# Patient Record
Sex: Female | Born: 1965 | Race: Black or African American | Hispanic: No | Marital: Single | Smoking: Never smoker
Health system: Southern US, Community
[De-identification: ages and names within clinical notes are randomized; demographics above are authoritative.]

## PROBLEM LIST (undated history)

## (undated) ENCOUNTER — Ambulatory Visit (HOSPITAL_COMMUNITY): Admission: EM | Payer: Medicaid Other | Source: Home / Self Care

## (undated) DIAGNOSIS — F419 Anxiety disorder, unspecified: Secondary | ICD-10-CM

## (undated) DIAGNOSIS — E119 Type 2 diabetes mellitus without complications: Secondary | ICD-10-CM

## (undated) DIAGNOSIS — K219 Gastro-esophageal reflux disease without esophagitis: Secondary | ICD-10-CM

## (undated) DIAGNOSIS — N289 Disorder of kidney and ureter, unspecified: Secondary | ICD-10-CM

## (undated) DIAGNOSIS — N200 Calculus of kidney: Secondary | ICD-10-CM

## (undated) HISTORY — DX: Calculus of kidney: N20.0

## (undated) HISTORY — DX: Disorder of kidney and ureter, unspecified: N28.9

---

## 1998-09-12 ENCOUNTER — Ambulatory Visit (HOSPITAL_COMMUNITY): Admission: RE | Admit: 1998-09-12 | Discharge: 1998-09-12 | Payer: Self-pay | Admitting: *Deleted

## 1999-01-03 ENCOUNTER — Inpatient Hospital Stay (HOSPITAL_COMMUNITY): Admission: AD | Admit: 1999-01-03 | Discharge: 1999-01-03 | Payer: Self-pay | Admitting: *Deleted

## 1999-01-14 ENCOUNTER — Inpatient Hospital Stay (HOSPITAL_COMMUNITY): Admission: AD | Admit: 1999-01-14 | Discharge: 1999-01-14 | Payer: Self-pay | Admitting: *Deleted

## 1999-01-15 ENCOUNTER — Ambulatory Visit (HOSPITAL_COMMUNITY): Admission: RE | Admit: 1999-01-15 | Discharge: 1999-01-15 | Payer: Self-pay | Admitting: Obstetrics

## 1999-01-21 ENCOUNTER — Inpatient Hospital Stay (HOSPITAL_COMMUNITY): Admission: AD | Admit: 1999-01-21 | Discharge: 1999-01-22 | Payer: Self-pay | Admitting: *Deleted

## 1999-01-22 ENCOUNTER — Inpatient Hospital Stay (HOSPITAL_COMMUNITY): Admission: AD | Admit: 1999-01-22 | Discharge: 1999-01-22 | Payer: Self-pay | Admitting: *Deleted

## 1999-01-24 ENCOUNTER — Inpatient Hospital Stay (HOSPITAL_COMMUNITY): Admission: AD | Admit: 1999-01-24 | Discharge: 1999-01-26 | Payer: Self-pay | Admitting: Obstetrics & Gynecology

## 1999-01-25 ENCOUNTER — Encounter: Payer: Self-pay | Admitting: Obstetrics & Gynecology

## 2000-02-01 ENCOUNTER — Emergency Department (HOSPITAL_COMMUNITY): Admission: EM | Admit: 2000-02-01 | Discharge: 2000-02-01 | Payer: Self-pay | Admitting: Emergency Medicine

## 2000-10-02 ENCOUNTER — Emergency Department (HOSPITAL_COMMUNITY): Admission: EM | Admit: 2000-10-02 | Discharge: 2000-10-02 | Payer: Self-pay | Admitting: Emergency Medicine

## 2001-02-09 ENCOUNTER — Inpatient Hospital Stay (HOSPITAL_COMMUNITY): Admission: AD | Admit: 2001-02-09 | Discharge: 2001-02-09 | Payer: Self-pay | Admitting: *Deleted

## 2003-09-16 ENCOUNTER — Emergency Department (HOSPITAL_COMMUNITY): Admission: EM | Admit: 2003-09-16 | Discharge: 2003-09-16 | Payer: Self-pay | Admitting: Emergency Medicine

## 2003-10-07 ENCOUNTER — Emergency Department (HOSPITAL_COMMUNITY): Admission: EM | Admit: 2003-10-07 | Discharge: 2003-10-07 | Payer: Self-pay | Admitting: Emergency Medicine

## 2003-10-08 ENCOUNTER — Emergency Department (HOSPITAL_COMMUNITY): Admission: EM | Admit: 2003-10-08 | Discharge: 2003-10-08 | Payer: Self-pay | Admitting: Emergency Medicine

## 2004-08-22 ENCOUNTER — Emergency Department (HOSPITAL_COMMUNITY): Admission: EM | Admit: 2004-08-22 | Discharge: 2004-08-22 | Payer: Self-pay | Admitting: Family Medicine

## 2004-10-09 ENCOUNTER — Emergency Department (HOSPITAL_COMMUNITY): Admission: EM | Admit: 2004-10-09 | Discharge: 2004-10-09 | Payer: Self-pay | Admitting: Family Medicine

## 2005-04-08 ENCOUNTER — Emergency Department (HOSPITAL_COMMUNITY): Admission: EM | Admit: 2005-04-08 | Discharge: 2005-04-08 | Payer: Self-pay | Admitting: Emergency Medicine

## 2005-11-10 ENCOUNTER — Emergency Department (HOSPITAL_COMMUNITY): Admission: EM | Admit: 2005-11-10 | Discharge: 2005-11-10 | Payer: Self-pay | Admitting: Family Medicine

## 2005-11-13 ENCOUNTER — Ambulatory Visit (HOSPITAL_COMMUNITY): Admission: RE | Admit: 2005-11-13 | Discharge: 2005-11-13 | Payer: Self-pay | Admitting: Pulmonary Disease

## 2006-05-25 ENCOUNTER — Encounter: Admission: RE | Admit: 2006-05-25 | Discharge: 2006-05-25 | Payer: Self-pay | Admitting: *Deleted

## 2006-07-26 ENCOUNTER — Emergency Department (HOSPITAL_COMMUNITY): Admission: EM | Admit: 2006-07-26 | Discharge: 2006-07-26 | Payer: Self-pay | Admitting: Emergency Medicine

## 2006-07-31 ENCOUNTER — Emergency Department (HOSPITAL_COMMUNITY): Admission: EM | Admit: 2006-07-31 | Discharge: 2006-07-31 | Payer: Self-pay | Admitting: *Deleted

## 2007-06-02 ENCOUNTER — Emergency Department (HOSPITAL_COMMUNITY): Admission: EM | Admit: 2007-06-02 | Discharge: 2007-06-02 | Payer: Self-pay | Admitting: Family Medicine

## 2007-12-17 ENCOUNTER — Emergency Department (HOSPITAL_COMMUNITY): Admission: EM | Admit: 2007-12-17 | Discharge: 2007-12-17 | Payer: Self-pay | Admitting: Family Medicine

## 2008-09-19 ENCOUNTER — Emergency Department (HOSPITAL_COMMUNITY): Admission: EM | Admit: 2008-09-19 | Discharge: 2008-09-19 | Payer: Self-pay | Admitting: Emergency Medicine

## 2009-11-04 ENCOUNTER — Inpatient Hospital Stay (HOSPITAL_COMMUNITY): Admission: AD | Admit: 2009-11-04 | Discharge: 2009-11-04 | Payer: Self-pay | Admitting: Obstetrics & Gynecology

## 2009-11-04 ENCOUNTER — Ambulatory Visit: Payer: Self-pay | Admitting: Physician Assistant

## 2009-12-06 ENCOUNTER — Ambulatory Visit: Payer: Self-pay | Admitting: Obstetrics and Gynecology

## 2010-02-25 ENCOUNTER — Emergency Department (HOSPITAL_COMMUNITY): Admission: EM | Admit: 2010-02-25 | Discharge: 2010-02-26 | Payer: Self-pay | Admitting: Emergency Medicine

## 2010-06-30 ENCOUNTER — Emergency Department (HOSPITAL_COMMUNITY): Admission: EM | Admit: 2010-06-30 | Discharge: 2010-07-01 | Payer: Self-pay | Admitting: Emergency Medicine

## 2010-07-13 ENCOUNTER — Emergency Department (HOSPITAL_COMMUNITY): Admission: EM | Admit: 2010-07-13 | Discharge: 2010-07-13 | Payer: Self-pay | Admitting: Emergency Medicine

## 2010-07-16 ENCOUNTER — Emergency Department (HOSPITAL_COMMUNITY): Admission: EM | Admit: 2010-07-16 | Discharge: 2010-07-17 | Payer: Self-pay | Admitting: Emergency Medicine

## 2010-09-11 ENCOUNTER — Inpatient Hospital Stay (HOSPITAL_COMMUNITY): Admission: AD | Admit: 2010-09-11 | Discharge: 2010-09-11 | Payer: Self-pay | Admitting: Obstetrics & Gynecology

## 2010-09-11 ENCOUNTER — Ambulatory Visit: Payer: Self-pay | Admitting: Obstetrics & Gynecology

## 2010-12-22 ENCOUNTER — Encounter: Payer: Self-pay | Admitting: Family Medicine

## 2011-02-12 ENCOUNTER — Inpatient Hospital Stay (HOSPITAL_COMMUNITY)
Admission: AD | Admit: 2011-02-12 | Discharge: 2011-02-12 | Disposition: A | Discharge status: Home / Self Care | Payer: Medicaid Other | Source: Ambulatory Visit | Attending: Obstetrics & Gynecology | Admitting: Obstetrics & Gynecology

## 2011-02-12 ENCOUNTER — Other Ambulatory Visit: Payer: Self-pay | Admitting: Obstetrics & Gynecology

## 2011-02-12 DIAGNOSIS — N938 Other specified abnormal uterine and vaginal bleeding: Secondary | ICD-10-CM | POA: Insufficient documentation

## 2011-02-12 DIAGNOSIS — N949 Unspecified condition associated with female genital organs and menstrual cycle: Secondary | ICD-10-CM | POA: Insufficient documentation

## 2011-02-12 DIAGNOSIS — N925 Other specified irregular menstruation: Secondary | ICD-10-CM

## 2011-02-12 DIAGNOSIS — D259 Leiomyoma of uterus, unspecified: Secondary | ICD-10-CM

## 2011-02-12 LAB — WET PREP, GENITAL
Trich, Wet Prep: NONE SEEN
Yeast Wet Prep HPF POC: NONE SEEN

## 2011-02-12 LAB — POCT PREGNANCY, URINE: Preg Test, Ur: NEGATIVE

## 2011-02-12 LAB — GC/CHLAMYDIA PROBE AMP, GENITAL
Chlamydia, DNA Probe: NEGATIVE
GC Probe Amp, Genital: NEGATIVE

## 2011-02-14 LAB — DIFFERENTIAL
Basophils Absolute: 0 10*3/uL (ref 0.0–0.1)
Basophils Absolute: 0 10*3/uL (ref 0.0–0.1)
Basophils Relative: 0 % (ref 0–1)
Basophils Relative: 0 % (ref 0–1)
Eosinophils Absolute: 0.1 10*3/uL (ref 0.0–0.7)
Eosinophils Absolute: 0.1 10*3/uL (ref 0.0–0.7)
Eosinophils Relative: 0 % (ref 0–5)
Eosinophils Relative: 1 % (ref 0–5)
Lymphocytes Relative: 14 % (ref 12–46)
Lymphocytes Relative: 5 % — ABNORMAL LOW (ref 12–46)
Lymphs Abs: 0.8 10*3/uL (ref 0.7–4.0)
Lymphs Abs: 1.9 10*3/uL (ref 0.7–4.0)
Monocytes Absolute: 0.8 10*3/uL (ref 0.1–1.0)
Monocytes Absolute: 0.9 10*3/uL (ref 0.1–1.0)
Monocytes Relative: 6 % (ref 3–12)
Monocytes Relative: 6 % (ref 3–12)
Neutro Abs: 10.9 10*3/uL — ABNORMAL HIGH (ref 1.7–7.7)
Neutro Abs: 12.4 10*3/uL — ABNORMAL HIGH (ref 1.7–7.7)
Neutrophils Relative %: 79 % — ABNORMAL HIGH (ref 43–77)
Neutrophils Relative %: 88 % — ABNORMAL HIGH (ref 43–77)

## 2011-02-14 LAB — BASIC METABOLIC PANEL
BUN: 12 mg/dL (ref 6–23)
CO2: 22 mEq/L (ref 19–32)
Calcium: 8.8 mg/dL (ref 8.4–10.5)
Chloride: 106 mEq/L (ref 96–112)
Creatinine, Ser: 0.76 mg/dL (ref 0.4–1.2)
GFR calc Af Amer: >60 mL/min (ref >60)
GFR calc non Af Amer: >60 mL/min (ref >60)
Glucose, Bld: 132 mg/dL — ABNORMAL HIGH (ref 70–99)
Potassium: 3.6 mEq/L (ref 3.5–5.1)
Sodium: 136 mEq/L (ref 135–145)

## 2011-02-14 LAB — CBC
HCT: 29.4 % — ABNORMAL LOW (ref 36.0–46.0)
HCT: 32.8 % — ABNORMAL LOW (ref 36.0–46.0)
Hemoglobin: 11.1 g/dL — ABNORMAL LOW (ref 12.0–15.0)
Hemoglobin: 9.9 g/dL — ABNORMAL LOW (ref 12.0–15.0)
MCH: 29.4 pg (ref 26.0–34.0)
MCH: 30 pg (ref 26.0–34.0)
MCHC: 33.6 g/dL (ref 30.0–36.0)
MCHC: 33.9 g/dL (ref 30.0–36.0)
MCV: 87.6 fL (ref 78.0–100.0)
MCV: 88.4 fL (ref 78.0–100.0)
Platelets: 240 10*3/uL (ref 150–400)
Platelets: 340 10*3/uL (ref 150–400)
RBC: 3.35 MIL/uL — ABNORMAL LOW (ref 3.87–5.11)
RBC: 3.71 MIL/uL — ABNORMAL LOW (ref 3.87–5.11)
RDW: 18.7 % — ABNORMAL HIGH (ref 11.5–15.5)
RDW: 18.9 % — ABNORMAL HIGH (ref 11.5–15.5)
WBC: 13.7 10*3/uL — ABNORMAL HIGH (ref 4.0–10.5)
WBC: 14.1 10*3/uL — ABNORMAL HIGH (ref 4.0–10.5)

## 2011-02-14 LAB — POCT I-STAT, CHEM 8
BUN: 8 mg/dL (ref 6–23)
Calcium, Ion: 1.04 mmol/L — ABNORMAL LOW (ref 1.12–1.32)
Chloride: 103 mEq/L (ref 96–112)
Creatinine, Ser: 0.9 mg/dL (ref 0.4–1.2)
Glucose, Bld: 110 mg/dL — ABNORMAL HIGH (ref 70–99)
HCT: 32 % — ABNORMAL LOW (ref 36.0–46.0)
Hemoglobin: 10.9 g/dL — ABNORMAL LOW (ref 12.0–15.0)
Potassium: 3.1 mEq/L — ABNORMAL LOW (ref 3.5–5.1)
Sodium: 137 mEq/L (ref 135–145)
TCO2: 24 mmol/L (ref 0–100)

## 2011-02-15 LAB — URINALYSIS, ROUTINE W REFLEX MICROSCOPIC
Glucose, UA: NEGATIVE mg/dL
Hgb urine dipstick: NEGATIVE
Ketones, ur: 40 mg/dL — AB
Leukocytes, UA: NEGATIVE
Nitrite: NEGATIVE
Protein, ur: 30 mg/dL — AB
Specific Gravity, Urine: 1.028 (ref 1.005–1.030)
Urobilinogen, UA: 1 mg/dL (ref 0.0–1.0)
pH: 7 (ref 5.0–8.0)

## 2011-02-15 LAB — POCT I-STAT, CHEM 8
BUN: 10 mg/dL (ref 6–23)
Calcium, Ion: 1.07 mmol/L — ABNORMAL LOW (ref 1.12–1.32)
Chloride: 108 mEq/L (ref 96–112)
Creatinine, Ser: 0.7 mg/dL (ref 0.4–1.2)
Glucose, Bld: 125 mg/dL — ABNORMAL HIGH (ref 70–99)
HCT: 41 % (ref 36.0–46.0)
Hemoglobin: 13.9 g/dL (ref 12.0–15.0)
Potassium: 3.5 mEq/L (ref 3.5–5.1)
Sodium: 143 mEq/L (ref 135–145)
TCO2: 25 mmol/L (ref 0–100)

## 2011-02-15 LAB — URINE MICROSCOPIC-ADD ON

## 2011-03-04 LAB — CBC
HCT: 31.6 % — ABNORMAL LOW (ref 36.0–46.0)
Hemoglobin: 10 g/dL — ABNORMAL LOW (ref 12.0–15.0)
MCHC: 31.7 g/dL (ref 30.0–36.0)
MCV: 82.7 fL (ref 78.0–100.0)
Platelets: 348 10*3/uL (ref 150–400)
RBC: 3.82 MIL/uL — ABNORMAL LOW (ref 3.87–5.11)
RDW: 17.4 % — ABNORMAL HIGH (ref 11.5–15.5)
WBC: 5.2 10*3/uL (ref 4.0–10.5)

## 2011-03-04 LAB — WET PREP, GENITAL
Trich, Wet Prep: NONE SEEN
WBC, Wet Prep HPF POC: NONE SEEN
Yeast Wet Prep HPF POC: NONE SEEN

## 2011-03-04 LAB — GC/CHLAMYDIA PROBE AMP, GENITAL
Chlamydia, DNA Probe: NEGATIVE
GC Probe Amp, Genital: POSITIVE — AB

## 2011-03-04 LAB — POCT PREGNANCY, URINE: Preg Test, Ur: NEGATIVE

## 2013-04-30 ENCOUNTER — Encounter (HOSPITAL_COMMUNITY): Payer: Self-pay | Admitting: *Deleted

## 2013-04-30 ENCOUNTER — Emergency Department (HOSPITAL_COMMUNITY)
Admission: EM | Admit: 2013-04-30 | Discharge: 2013-04-30 | Disposition: A | Discharge status: Home / Self Care | Payer: Medicaid Other | Attending: Emergency Medicine | Admitting: Emergency Medicine

## 2013-04-30 DIAGNOSIS — J069 Acute upper respiratory infection, unspecified: Secondary | ICD-10-CM | POA: Insufficient documentation

## 2013-04-30 MED ORDER — BENZONATATE 100 MG PO CAPS: 200.0000 mg | ORAL_CAPSULE | Freq: Two times a day (BID) | ORAL | Status: DC | PRN

## 2013-04-30 MED ORDER — OXYMETAZOLINE HCL 0.05 % NA SOLN: 2.0000 | Freq: Two times a day (BID) | NASAL | Status: DC

## 2013-04-30 NOTE — ED Notes (Signed)
Pt is here with ears ringing and congestion and reports throat drainage

## 2013-04-30 NOTE — ED Provider Notes (Signed)
History     CSN: [id number]  Arrival date & time 04/30/13  1114   First MD Initiated Contact with Patient 04/30/13 1138      Chief Complaint  Patient presents with  . Nasal Congestion    (Consider location/radiation/quality/duration/timing/severity/associated sxs/prior treatment) HPI Comments: 47 y.o. Female with no past medical history presents complaining about sinus congestion and dry cough for the last three days. Pt has been trying Musinex without relief.  Patient is a 47 y.o. female presenting with URI. The history is provided by the patient.  URI Presenting symptoms: cough   Presenting symptoms: no fever and no sore throat   Presenting symptoms comment:  Dry cough, congestion, stuffy ears, post nasal drip Severity:  Moderate Onset quality:  Gradual Duration: 4 days. Timing:  Constant Progression:  Unchanged Chronicity:  New Relieved by: pt has tried mucinex and OTC cough suppressant with mild relief. Worsened by:  Nothing tried Associated symptoms: no headaches, no myalgias, no neck pain, no sinus pain, no sneezing and no swollen glands   Risk factors: no recent travel and no sick contacts     History reviewed. No pertinent past medical history.  History reviewed. No pertinent past surgical history.  No family history on file.  History  Substance Use Topics  . Smoking status: Never Smoker   . Smokeless tobacco: Not on file  . Alcohol Use: No    OB History   Grav Para Term Preterm Abortions TAB SAB Ect Mult Living                  Review of Systems  Constitutional: Negative for fever and diaphoresis.  HENT: Positive for postnasal drip. Negative for sore throat, sneezing, trouble swallowing, neck pain, neck stiffness and sinus pressure.        "stuffy ears" bilaterally  Eyes: Negative for visual disturbance.  Respiratory: Positive for cough. Negative for apnea, chest tightness and shortness of breath.   Cardiovascular: Negative for chest pain and  palpitations.  Gastrointestinal: Negative for nausea, vomiting, abdominal pain, diarrhea and constipation.  Genitourinary: Negative for dysuria.  Musculoskeletal: Negative for myalgias and gait problem.  Skin: Negative for rash.  Neurological: Negative for dizziness, weakness, light-headedness, numbness and headaches.    Allergies  Review of patient's allergies indicates no known allergies.  Home Medications   Current Outpatient Rx  Name  Route  Sig  Dispense  Refill  . guaiFENesin (MUCINEX) 600 MG 12 hr tablet   Oral   Take 1,200 mg by mouth daily as needed for congestion.         . Pseudoeph-Doxylamine-DM-APAP 60-7.04-29-999 MG/30ML LIQD   Oral   Take 15 mLs by mouth once.           BP 124/71  Pulse 81  Temp(Src) 98.5 F (36.9 C) (Oral)  Resp 18  SpO2 98%  LMP 04/10/2013  Physical Exam  Nursing note and vitals reviewed. Constitutional: She is oriented to person, place, and time. She appears well-developed and well-nourished. No distress.  HENT:  Head: Normocephalic and atraumatic.  Right Ear: Tympanic membrane normal.  Left Ear: Tympanic membrane normal.  Nose: Mucosal edema present. No rhinorrhea or sinus tenderness. Right sinus exhibits no maxillary sinus tenderness and no frontal sinus tenderness. Left sinus exhibits no maxillary sinus tenderness and no frontal sinus tenderness.  Mouth/Throat: Posterior oropharyngeal erythema present. No oropharyngeal exudate or posterior oropharyngeal edema.  cobblestoning  Eyes: Conjunctivae and EOM are normal.  Neck: Normal range of motion. Neck supple.  No meningeal signs  Cardiovascular: Normal rate, regular rhythm and normal heart sounds.  Exam reveals no gallop and no friction rub.   No murmur heard. Pulmonary/Chest: Effort normal and breath sounds normal. No respiratory distress. She has no wheezes. She has no rales. She exhibits no tenderness.  Abdominal: Soft. Bowel sounds are normal. She exhibits no distension.  There is no tenderness. There is no rebound and no guarding.  Musculoskeletal: Normal range of motion. She exhibits no edema and no tenderness.  Neurological: She is alert and oriented to person, place, and time. No cranial nerve deficit.  Skin: Skin is warm and dry. She is not diaphoretic. No erythema.    ED Course  Procedures (including critical care time)  Labs Reviewed - No data to display No results found. Discharge Medication List as of 04/30/2013 12:45 PM    START taking these medications   Details  benzonatate (TESSALON) 100 MG capsule Take 2 capsules (200 mg total) by mouth 2 (two) times daily as needed for cough., Starting 04/30/2013, Until Discontinued, Print    oxymetazoline (AFRIN NASAL SPRAY) 0.05 % nasal spray Place 2 sprays into the nose 2 (two) times daily., Starting 04/30/2013, Until Discontinued, Print         1. Upper respiratory infection       MDM  Patients symptoms are consistent with URI, likely viral etiology. Discussed that antibiotics are not indicated for viral infections. Pt will be discharged with symptomatic treatment.  Verbalizes understanding and is agreeable with plan. Pt is hemodynamically stable & in NAD prior to dc.   Glade Nurse, PA-C 04/30/13 2123

## 2013-05-01 NOTE — ED Provider Notes (Signed)
Medical screening examination/treatment/procedure(s) were performed by non-physician practitioner and as supervising physician I was immediately available for consultation/collaboration.   Lisvet Rasheed, MD 05/01/13 0703 

## 2015-05-21 ENCOUNTER — Inpatient Hospital Stay (HOSPITAL_COMMUNITY): Payer: Medicaid Other

## 2015-05-21 ENCOUNTER — Encounter (HOSPITAL_COMMUNITY): Payer: Self-pay

## 2015-05-21 ENCOUNTER — Inpatient Hospital Stay (HOSPITAL_COMMUNITY)
Admission: AD | Admit: 2015-05-21 | Discharge: 2015-05-21 | Disposition: A | Discharge status: Home / Self Care | Payer: Medicaid Other | Source: Ambulatory Visit | Attending: Obstetrics and Gynecology | Admitting: Obstetrics and Gynecology

## 2015-05-21 DIAGNOSIS — R1030 Lower abdominal pain, unspecified: Secondary | ICD-10-CM | POA: Diagnosis not present

## 2015-05-21 DIAGNOSIS — N8320 Unspecified ovarian cysts: Secondary | ICD-10-CM | POA: Insufficient documentation

## 2015-05-21 DIAGNOSIS — D252 Subserosal leiomyoma of uterus: Secondary | ICD-10-CM

## 2015-05-21 DIAGNOSIS — D259 Leiomyoma of uterus, unspecified: Secondary | ICD-10-CM | POA: Diagnosis not present

## 2015-05-21 DIAGNOSIS — R109 Unspecified abdominal pain: Secondary | ICD-10-CM

## 2015-05-21 LAB — URINALYSIS, ROUTINE W REFLEX MICROSCOPIC
Bilirubin Urine: NEGATIVE
Glucose, UA: NEGATIVE mg/dL
Hgb urine dipstick: NEGATIVE
Ketones, ur: NEGATIVE mg/dL
Leukocytes, UA: NEGATIVE
Nitrite: NEGATIVE
Protein, ur: NEGATIVE mg/dL
Specific Gravity, Urine: >1.03 — ABNORMAL HIGH (ref 1.005–1.030)
Urobilinogen, UA: 1 mg/dL (ref 0.0–1.0)
pH: 5.5 (ref 5.0–8.0)

## 2015-05-21 LAB — CBC
HCT: 37.5 % (ref 36.0–46.0)
Hemoglobin: 12.7 g/dL (ref 12.0–15.0)
MCH: 31.8 pg (ref 26.0–34.0)
MCHC: 33.9 g/dL (ref 30.0–36.0)
MCV: 93.8 fL (ref 78.0–100.0)
Platelets: 218 10*3/uL (ref 150–400)
RBC: 4 MIL/uL (ref 3.87–5.11)
RDW: 13.7 % (ref 11.5–15.5)
WBC: 4.5 10*3/uL (ref 4.0–10.5)

## 2015-05-21 LAB — WET PREP, GENITAL
Trich, Wet Prep: NONE SEEN
Yeast Wet Prep HPF POC: NONE SEEN

## 2015-05-21 LAB — POCT PREGNANCY, URINE: Preg Test, Ur: NEGATIVE

## 2015-05-21 MED ORDER — IBUPROFEN 800 MG PO TABS: 800.0000 mg | ORAL_TABLET | Freq: Three times a day (TID) | ORAL | Status: DC

## 2015-05-21 MED ORDER — KETOROLAC TROMETHAMINE 60 MG/2ML IM SOLN
60.0000 mg | Freq: Once | INTRAMUSCULAR | Status: AC
  Administered 2015-05-21: 60 mg via INTRAMUSCULAR
  Filled 2015-05-21: qty 2

## 2015-05-21 NOTE — MAU Note (Signed)
Pt presents complaining of abdominal pain that she's had for 2 weeks but got worse the last couple of days. Denies constipation or diarrhea. Reports a yellow vaginal discharge and pain after urinating.

## 2015-05-21 NOTE — MAU Provider Note (Signed)
History     CSN: [id number]  Arrival date and time: 05/21/15 1143   None     Chief Complaint  Patient presents with  . Abdominal Pain   HPI Pt is not pregnant - pt's LMP 2 mos ago.  Pt c/o of yellow vaginal discharge for about 1 week.  Had IC 1 week ago and had to stop b/c lower abdominal pain.  Pt also c/o pain/pressure with  urinating - she has had frequency of urination with incomplete emptying  Pt denies N/V/D or back pain. Pt has hx of fibroids  RN note:  Registered Nurse Signed  MAU Note 05/21/2015 11:58 AM    Expand All Collapse All   Pt presents complaining of abdominal pain that she's had for 2 weeks but got worse the last couple of days. Denies constipation or diarrhea. Reports a yellow vaginal discharge and pain after urinating.        History reviewed. No pertinent past medical history.  History reviewed. No pertinent past surgical history.  History reviewed. No pertinent family history.  History  Substance Use Topics  . Smoking status: Never Smoker   . Smokeless tobacco: Not on file  . Alcohol Use: No    Allergies: No Known Allergies  Prescriptions prior to admission  Medication Sig Dispense Refill Last Dose  . benzonatate (TESSALON) 100 MG capsule Take 2 capsules (200 mg total) by mouth 2 (two) times daily as needed for cough. 20 capsule 0   . guaiFENesin (MUCINEX) 600 MG 12 hr tablet Take 1,200 mg by mouth daily as needed for congestion.   04/29/2013 at Unknown  . oxymetazoline (AFRIN NASAL SPRAY) 0.05 % nasal spray Place 2 sprays into the nose 2 (two) times daily. 30 mL 0   . Pseudoeph-Doxylamine-DM-APAP 60-7.04-29-999 MG/30ML LIQD Take 15 mLs by mouth once.   04/29/2013 at Unknown    Review of Systems  Constitutional: Negative for fever and chills.  Gastrointestinal: Positive for abdominal pain. Negative for nausea, vomiting and diarrhea.  Genitourinary: Positive for dysuria, urgency and frequency.  Musculoskeletal: Negative for back pain.    Physical Exam   Blood pressure 123/84, pulse 114, temperature 98 F (36.7 C), temperature source Oral, resp. rate 18.  Physical Exam  Nursing note and vitals reviewed. Constitutional: She is oriented to person, place, and time. She appears well-developed and well-nourished. No distress.  HENT:  Head: Normocephalic.  Eyes: Pupils are equal, round, and reactive to light.  Neck: Normal range of motion. Neck supple.  Cardiovascular: Normal rate.   Respiratory: Effort normal.  GI: Soft. She exhibits no distension. There is tenderness. There is guarding. There is no rebound.  Genitourinary:  Vaginal mucosa pale pink, moist; small amount of white discharge in vault; cervix not visualized due to habitus- but nontender with palpation; bilateral adnexa extremely tender with palpation- no rebound; uterus difficult to palpate - suprapubic tenderness- no rebound  Musculoskeletal: Normal range of motion.  Neurological: She is alert and oriented to person, place, and time.  Skin: Skin is warm and dry.  Psychiatric: She has a normal mood and affect.    MAU Course  Procedures Results for orders placed or performed during the hospital encounter of 05/21/15 (from the past 24 hour(s))  Urinalysis, Routine w reflex microscopic (not at [facility])     Status: Abnormal   Collection Time: 05/21/15 11:47 AM  Result Value Ref Range   Color, Urine YELLOW YELLOW   APPearance CLEAR CLEAR   Specific Gravity, Urine >1.030 (H)  1.005 - 1.030   pH 5.5 5.0 - 8.0   Glucose, UA NEGATIVE NEGATIVE mg/dL   Hgb urine dipstick NEGATIVE NEGATIVE   Bilirubin Urine NEGATIVE NEGATIVE   Ketones, ur NEGATIVE NEGATIVE mg/dL   Protein, ur NEGATIVE NEGATIVE mg/dL   Urobilinogen, UA 1.0 0.0 - 1.0 mg/dL   Nitrite NEGATIVE NEGATIVE   Leukocytes, UA NEGATIVE NEGATIVE  Pregnancy, urine POC     Status: None   Collection Time: 05/21/15 12:06 PM  Result Value Ref Range   Preg Test, Ur NEGATIVE NEGATIVE  CBC     Status: None    Collection Time: 05/21/15 12:13 PM  Result Value Ref Range   WBC 4.5 4.0 - 10.5 K/uL   RBC 4.00 3.87 - 5.11 MIL/uL   Hemoglobin 12.7 12.0 - 15.0 g/dL   HCT 37.5 36.0 - 46.0 %   MCV 93.8 78.0 - 100.0 fL   MCH 31.8 26.0 - 34.0 pg   MCHC 33.9 30.0 - 36.0 g/dL   RDW 13.7 11.5 - 15.5 %   Platelets 218 150 - 400 K/uL  Wet prep, genital     Status: Abnormal   Collection Time: 05/21/15 12:15 PM  Result Value Ref Range   Yeast Wet Prep HPF POC NONE SEEN NONE SEEN   Trich, Wet Prep NONE SEEN NONE SEEN   Clue Cells Wet Prep HPF POC FEW (A) NONE SEEN   WBC, Wet Prep HPF POC FEW (A) NONE SEEN  US Transvaginal Non-ob  05/21/2015   CLINICAL DATA:  Increasing abdominal pain.  Symptoms for 2 weeks.  EXAM: TRANSABDOMINAL AND TRANSVAGINAL ULTRASOUND OF PELVIS  TECHNIQUE: Both transabdominal and transvaginal ultrasound examinations of the pelvis were performed. Transabdominal technique was performed for global imaging of the pelvis including uterus, ovaries, adnexal regions, and pelvic cul-de-sac. It was necessary to proceed with endovaginal exam following the transabdominal exam to visualize the uterus and endometrium.  COMPARISON:  11/04/2009  FINDINGS: Uterus  Measurements: 10.8 x 7.1 x 8.7 cm. There are nabothian cysts. Normal appearance of the cervix. No significant fluid in the cervix. Normal anteverted position of the uterus. There is a round hypoechoic structure along the left side of the fundus measuring 2.8 x 2.4 x 2.7 cm. There is a central heterogeneous lesion in the uterus measuring 3.2 x 3.4 x 3.4 cm, suggestive for submucosal fibroid. Small lesion in the mid fundus region measures up to 1.6 cm. Heterogeneous structure along the anterior uterus measures 2.6 x 2.4 x 2.6 cm. Findings are compatible with multiple fibroids.  Endometrium  Thickness: 0.7 cm. Moderate amount of fluid within the endometrial cavity.  Right ovary  Measurements: 5.2 x 4.3 x 3.4 cm. There is an anechoic structure associated with  right ovary with internal echoes. Findings are most compatible with a complex cyst. This complex cyst measures 3.3 x 2.7 x 3.3 cm.  Left ovary  Measurements: 2.9 x 2.0 x 2.9 cm. Normal appearance/no adnexal mass.  Other findings  Trace free fluid.  IMPRESSION: Uterine fibroids.  There is a dominant submucosal fibroid.  Moderate amount of fluid within the endometrial cavity. Etiology for the endometrial fluid is unknown but could be related to the dominant submucosal fibroid.  Mildly complex right ovarian cyst.   Electronically Signed   By: Markus Daft M.D.   On: 05/21/2015 15:29   US Pelvis Complete  05/21/2015   CLINICAL DATA:  Increasing abdominal pain.  Symptoms for 2 weeks.  EXAM: TRANSABDOMINAL AND TRANSVAGINAL ULTRASOUND OF PELVIS  TECHNIQUE:  Both transabdominal and transvaginal ultrasound examinations of the pelvis were performed. Transabdominal technique was performed for global imaging of the pelvis including uterus, ovaries, adnexal regions, and pelvic cul-de-sac. It was necessary to proceed with endovaginal exam following the transabdominal exam to visualize the uterus and endometrium.  COMPARISON:  11/04/2009  FINDINGS: Uterus  Measurements: 10.8 x 7.1 x 8.7 cm. There are nabothian cysts. Normal appearance of the cervix. No significant fluid in the cervix. Normal anteverted position of the uterus. There is a round hypoechoic structure along the left side of the fundus measuring 2.8 x 2.4 x 2.7 cm. There is a central heterogeneous lesion in the uterus measuring 3.2 x 3.4 x 3.4 cm, suggestive for submucosal fibroid. Small lesion in the mid fundus region measures up to 1.6 cm. Heterogeneous structure along the anterior uterus measures 2.6 x 2.4 x 2.6 cm. Findings are compatible with multiple fibroids.  Endometrium  Thickness: 0.7 cm. Moderate amount of fluid within the endometrial cavity.  Right ovary  Measurements: 5.2 x 4.3 x 3.4 cm. There is an anechoic structure associated with right ovary with  internal echoes. Findings are most compatible with a complex cyst. This complex cyst measures 3.3 x 2.7 x 3.3 cm.  Left ovary  Measurements: 2.9 x 2.0 x 2.9 cm. Normal appearance/no adnexal mass.  Other findings  Trace free fluid.  IMPRESSION: Uterine fibroids.  There is a dominant submucosal fibroid.  Moderate amount of fluid within the endometrial cavity. Etiology for the endometrial fluid is unknown but could be related to the dominant submucosal fibroid.  Mildly complex right ovarian cyst.   Electronically Signed   By: Markus Daft M.D.   On: 05/21/2015 15:29  urine culture pending GC/chlamydia pending Assessment and Plan  abd pain Multiple fibroids Right ovarian complex cyst F/u with GYN clinic  [facility] 05/21/2015, 12:06 PM

## 2015-05-22 LAB — HIV ANTIBODY (ROUTINE TESTING W REFLEX): HIV Screen 4th Generation wRfx: NONREACTIVE

## 2015-05-22 LAB — GC/CHLAMYDIA PROBE AMP (~~LOC~~) NOT AT ARMC
Chlamydia: NEGATIVE
Neisseria Gonorrhea: NEGATIVE

## 2015-05-22 LAB — RPR: RPR Ser Ql: NONREACTIVE

## 2015-05-23 LAB — CULTURE, OB URINE

## 2015-06-22 ENCOUNTER — Emergency Department (HOSPITAL_COMMUNITY)
Admission: EM | Admit: 2015-06-22 | Discharge: 2015-06-22 | Disposition: A | Discharge status: Home / Self Care | Payer: Medicaid Other | Attending: Emergency Medicine | Admitting: Emergency Medicine

## 2015-06-22 DIAGNOSIS — R0981 Nasal congestion: Secondary | ICD-10-CM | POA: Diagnosis not present

## 2015-06-22 DIAGNOSIS — J029 Acute pharyngitis, unspecified: Secondary | ICD-10-CM | POA: Insufficient documentation

## 2015-06-22 DIAGNOSIS — Z791 Long term (current) use of non-steroidal anti-inflammatories (NSAID): Secondary | ICD-10-CM | POA: Insufficient documentation

## 2015-06-22 DIAGNOSIS — R05 Cough: Secondary | ICD-10-CM | POA: Diagnosis present

## 2015-06-22 DIAGNOSIS — R059 Cough, unspecified: Secondary | ICD-10-CM

## 2015-06-22 MED ORDER — BENZONATATE 100 MG PO CAPS: 100.0000 mg | ORAL_CAPSULE | Freq: Three times a day (TID) | ORAL | Status: DC

## 2015-06-22 NOTE — ED Notes (Signed)
Declined W/C at D/C and was escorted to lobby by RN. 

## 2015-06-22 NOTE — ED Notes (Signed)
Pt. Stated, I have a real bad cough that started yesterday.

## 2015-06-22 NOTE — ED Provider Notes (Signed)
CSN: [id number]     Arrival date & time 06/22/15  1051 History  This chart was scribed for non-physician practitioner, Larene Pickett, PA-C working with Orlie Dakin, MD by Rayna Sexton, ED scribe. This patient was seen in room TR08C/TR08C and the patient's care was started at 11:56 AM.   Chief Complaint  Patient presents with  . Cough  . Nasal Congestion   The history is provided by the patient. No language interpreter was used.    HPI Comments: Jasmin Collins is a 49 y.o. female who presents to the Emergency Department complaining of a worsening, moderate, productive cough with onset 1 day ago. She notes associated sputum when coughing as well as a mild, constant, sore throat. She notes taking cough syrup with no relief of her symptoms. Pt denies a hx of smoking. She denies fever or chills.   No past medical history on file. No past surgical history on file. No family history on file. History  Substance Use Topics  . Smoking status: Never Smoker   . Smokeless tobacco: Not on file  . Alcohol Use: No   OB History    No data available     Review of Systems  Constitutional: Negative for fever and chills.  HENT: Positive for sore throat.   Respiratory: Positive for cough.   All other systems reviewed and are negative.  Allergies  Review of patient's allergies indicates no known allergies.  Home Medications   Prior to Admission medications   Medication Sig Start Date End Date Taking? Authorizing Provider  benzonatate (TESSALON) 100 MG capsule Take 2 capsules (200 mg total) by mouth 2 (two) times daily as needed for cough. Patient not taking: Reported on 05/21/2015 04/30/13   Marny Lowenstein, PA-C  ibuprofen (ADVIL,MOTRIN) 200 MG tablet Take 400 mg by mouth every 6 (six) hours as needed for moderate pain.    Historical Provider, MD  ibuprofen (ADVIL,MOTRIN) 800 MG tablet Take 1 tablet (800 mg total) by mouth 3 (three) times daily. 05/21/15   West Pugh, NP   BP 127/84 mmHg   Pulse 100  Temp(Src) 98.6 F (37 C) (Oral)  Resp 17  Ht 5\' 3"  (1.6 m)  Wt 221 lb 1 oz (100.273 kg)  BMI 39.17 kg/m2   Physical Exam  Constitutional: She is oriented to person, place, and time. She appears well-developed and well-nourished. No distress.  HENT:  Head: Normocephalic and atraumatic.  Right Ear: Tympanic membrane and ear canal normal.  Left Ear: Tympanic membrane and ear canal normal.  Nose: Nose normal.  Mouth/Throat: Uvula is midline, oropharynx is clear and moist and mucous membranes are normal. No oropharyngeal exudate, posterior oropharyngeal edema, posterior oropharyngeal erythema or tonsillar abscesses.  Tonsils normal in appearance bilaterally without exudate; uvula midline without peritonsillar abscess; handling secretions appropriately; no difficulty swallowing or speaking  Eyes: Conjunctivae and EOM are normal. Pupils are equal, round, and reactive to light.  Neck: Normal range of motion. Neck supple.  Cardiovascular: Normal rate, regular rhythm and normal heart sounds.   Pulmonary/Chest: Effort normal and breath sounds normal. No respiratory distress. She has no wheezes. She has no rhonchi. She has no rales.  No distress, lungs clear bilaterally, dry cough noted  Abdominal: Soft. Bowel sounds are normal. There is no tenderness. There is no guarding.  Musculoskeletal: Normal range of motion. She exhibits no edema.  Neurological: She is alert and oriented to person, place, and time.  Skin: Skin is warm and dry. She is  not diaphoretic.  Psychiatric: She has a normal mood and affect.  Nursing note and vitals reviewed.   ED Course  Procedures  COORDINATION OF CARE: 11:59 AM Discussed treatment plan with pt at bedside and pt agreed to plan.  Labs Review Labs Reviewed - No data to display  Imaging Review No results found.   EKG Interpretation None      MDM   Final diagnoses:  Cough   49 year old female here with cough for 1 day. Patient is  afebrile, nontoxic. Her lungs are clear bilaterally without wheezes or rhonchi to suggest pneumonia. She does have a dry cough during exam. Remainder of her exam is benign. Suspect viral process.  Will discharge home with supportive care and cough medication. Patient is to follow-up with her PCP.  Discussed plan with patient, he/she acknowledged understanding and agreed with plan of care.  Return precautions given for new or worsening symptoms.  I personally performed the services described in this documentation, which was scribed in my presence. The recorded information has been reviewed and is accurate.  Larene Pickett, PA-C 06/22/15 1336  Orlie Dakin, MD 06/22/15 1537

## 2015-06-22 NOTE — Discharge Instructions (Signed)
Take the prescribed medication as directed for cough. Return to the ED for new or worsening symptoms.

## 2015-07-15 ENCOUNTER — Emergency Department (INDEPENDENT_AMBULATORY_CARE_PROVIDER_SITE_OTHER): Payer: Medicaid Other

## 2015-07-15 ENCOUNTER — Encounter (HOSPITAL_COMMUNITY): Payer: Self-pay | Admitting: Emergency Medicine

## 2015-07-15 ENCOUNTER — Emergency Department (INDEPENDENT_AMBULATORY_CARE_PROVIDER_SITE_OTHER)
Admission: EM | Admit: 2015-07-15 | Discharge: 2015-07-15 | Disposition: A | Discharge status: Home / Self Care | Payer: Medicaid Other | Source: Home / Self Care | Attending: Family Medicine | Admitting: Family Medicine

## 2015-07-15 DIAGNOSIS — S92912A Unspecified fracture of left toe(s), initial encounter for closed fracture: Secondary | ICD-10-CM

## 2015-07-15 NOTE — ED Notes (Signed)
Left foot pain.  Patient tripped over a toy and stumped left little toe into base board of wall.  Patient reports straightening toe.  Injury occurred last.

## 2015-07-15 NOTE — Discharge Instructions (Signed)
Ice, shoe and tylenol as needed, activity as tolerated.

## 2015-07-15 NOTE — ED Provider Notes (Signed)
CSN: [id number]     Arrival date & time 07/15/15  1702 History   First MD Initiated Contact with Patient 07/15/15 1715     Chief Complaint  Patient presents with  . Foot Pain   (Consider location/radiation/quality/duration/timing/severity/associated sxs/prior Treatment) Patient is a 49 y.o. female presenting with foot injury. The history is provided by the patient.  Foot Injury Location:  Toe (hit side of wall when tripped over child toy and had lat deformity which she straightened.) Time since incident:  1 day Toe location:  L little toe Pain details:    Quality:  Sharp   Severity:  Mild   Progression:  Partially resolved Chronicity:  New Dislocation: no   Foreign body present:  No foreign bodies Worsened by:  Bearing weight Associated symptoms: decreased ROM     History reviewed. No pertinent past medical history. History reviewed. No pertinent past surgical history. No family history on file. Social History  Substance Use Topics  . Smoking status: Never Smoker   . Smokeless tobacco: None  . Alcohol Use: No   OB History    No data available     Review of Systems  Constitutional: Negative.   Musculoskeletal: Positive for joint swelling and gait problem.  Skin: Negative.     Allergies  Review of patient's allergies indicates no known allergies.  Home Medications   Prior to Admission medications   Medication Sig Start Date End Date Taking? Authorizing Provider  benzonatate (TESSALON) 100 MG capsule Take 1 capsule (100 mg total) by mouth every 8 (eight) hours. 06/22/15   Larene Pickett, PA-C  ibuprofen (ADVIL,MOTRIN) 200 MG tablet Take 400 mg by mouth every 6 (six) hours as needed for moderate pain.    Historical Provider, MD  ibuprofen (ADVIL,MOTRIN) 800 MG tablet Take 1 tablet (800 mg total) by mouth 3 (three) times daily. 05/21/15   West Pugh, NP   BP 130/77 mmHg  Pulse 74  Temp(Src) 98.4 F (36.9 C) (Oral)  Resp 20  SpO2 97% Physical Exam    Constitutional: She is oriented to person, place, and time. She appears well-developed and well-nourished. She appears distressed.  Musculoskeletal: She exhibits tenderness.       Feet:  Neurological: She is alert and oriented to person, place, and time.  Skin: Skin is warm and dry.  Nursing note and vitals reviewed.   ED Course  Procedures (including critical care time) Labs Review Labs Reviewed - No data to display  Imaging Review Dg Toe 5th Left  07/15/2015   CLINICAL DATA:  Injury  EXAM: DG TOE 5TH LEFT  COMPARISON:  None.  FINDINGS: Three views of the left fifth toe submitted. There is nondisplaced fracture of distal phalanx.  IMPRESSION: Nondisplaced fracture of distal phalanx best seen on lateral view.   Electronically Signed   By: Lahoma Crocker M.D.   On: 07/15/2015 17:38     MDM   1. Toe fracture, left, closed, initial encounter        Billy Fischer, MD 07/15/15 1816

## 2015-09-26 ENCOUNTER — Emergency Department (HOSPITAL_COMMUNITY): Payer: Medicaid Other

## 2015-09-26 ENCOUNTER — Encounter (HOSPITAL_COMMUNITY): Payer: Self-pay | Admitting: *Deleted

## 2015-09-26 ENCOUNTER — Emergency Department (HOSPITAL_COMMUNITY)
Admission: EM | Admit: 2015-09-26 | Discharge: 2015-09-26 | Disposition: A | Discharge status: Home / Self Care | Payer: Medicaid Other | Attending: Emergency Medicine | Admitting: Emergency Medicine

## 2015-09-26 DIAGNOSIS — R05 Cough: Secondary | ICD-10-CM | POA: Insufficient documentation

## 2015-09-26 DIAGNOSIS — R059 Cough, unspecified: Secondary | ICD-10-CM

## 2015-09-26 MED ORDER — HYDROCOD POLST-CPM POLST ER 10-8 MG/5ML PO SUER
5.0000 mL | Freq: Once | ORAL | Status: AC
  Administered 2015-09-26: 5 mL via ORAL
  Filled 2015-09-26: qty 5

## 2015-09-26 MED ORDER — HYDROCOD POLST-CPM POLST ER 10-8 MG/5ML PO SUER: 5.0000 mL | Freq: Every evening | ORAL | Status: DC | PRN

## 2015-09-26 NOTE — Discharge Instructions (Signed)

## 2015-09-26 NOTE — ED Provider Notes (Signed)
CSN: [id number]     Arrival date & time 09/26/15  1007 History  By signing my name below, I, Erling Conte, attest that this documentation has been prepared under the direction and in the presence of Gloriann Loan, PA-C Electronically Signed: Erling Conte, ED Scribe. 09/26/2015. 12:02 PM.    Chief Complaint  Patient presents with  . Cough   The history is provided by the patient. No language interpreter was used.    HPI Comments: [name] is a 49 y.o. female with no PMHx who presents to the Emergency Department complaining of intermittent, gradually worsening, moderate, dry cough onset 2 days. Pt endorses that the cough was initially productive but now it has just become dry. She states she has mild associated rhinorrhea. She reports she had some leftover Tessalon pearls from a previous visit and she took them last night with no significant relief along with OTC cough meds. She states the cough has been keeping her from sleep. Pt is a non smoker and denies any personal history of asthma. She denies any known sick contacts. She denies any fever, chills, chest pain, SOB, wheezing, neck pain or stiffness, otalgia, nausea, vomiting, abdominal pain, or generalized myalgias.   History reviewed. No pertinent past medical history. History reviewed. No pertinent past surgical history. History reviewed. No pertinent family history. Social History  Substance Use Topics  . Smoking status: Never Smoker   . Smokeless tobacco: None  . Alcohol Use: No   OB History    No data available     Review of Systems 10 Systems reviewed and all are negative for acute change except as noted in the HPI.     Allergies  Review of patient's allergies indicates no known allergies.  Home Medications   Prior to Admission medications   Medication Sig Start Date End Date Taking? Authorizing Provider  benzonatate (TESSALON) 100 MG capsule Take 1 capsule (100 mg total) by mouth every 8 (eight) hours. 06/22/15    Larene Pickett, PA-C  chlorpheniramine-HYDROcodone (TUSSIONEX PENNKINETIC ER) 10-8 MG/5ML SUER Take 5 mLs by mouth at bedtime as needed for cough. 09/26/15   Gloriann Loan, PA-C  ibuprofen (ADVIL,MOTRIN) 200 MG tablet Take 400 mg by mouth every 6 (six) hours as needed for moderate pain.    Historical Provider, MD  ibuprofen (ADVIL,MOTRIN) 800 MG tablet Take 1 tablet (800 mg total) by mouth 3 (three) times daily. 05/21/15   West Pugh, NP   Triage Vitals: BP 124/78 mmHg  Pulse 86  Temp(Src) 98.6 F (37 C) (Oral)  Resp 16  SpO2 100%  Physical Exam  Constitutional: She is oriented to person, place, and time. She appears well-developed and well-nourished. No distress.  HENT:  Head: Normocephalic and atraumatic.  Nose: Nose normal.  Mouth/Throat: Oropharynx is clear and moist. No oropharyngeal exudate.  Eyes: Conjunctivae and EOM are normal.  Neck: Normal range of motion. Neck supple. No tracheal deviation present.  Cardiovascular: Normal rate, regular rhythm and normal heart sounds.   Pulmonary/Chest: Effort normal and breath sounds normal. No respiratory distress.  Abdominal: Soft. There is no tenderness.  Musculoskeletal: Normal range of motion.  Lymphadenopathy:    She has no cervical adenopathy.  Neurological: She is alert and oriented to person, place, and time.  Skin: Skin is warm and dry.  Psychiatric: She has a normal mood and affect. Her behavior is normal.  Nursing note and vitals reviewed.   ED Course  Procedures (including critical care time)  DIAGNOSTIC STUDIES:  Oxygen Saturation is 100% on RA, normal by my interpretation.    COORDINATION OF CARE:  10:28 AM- Will order CXR.  Pt advised of plan for treatment and pt agrees.  Labs Review Labs Reviewed - No data to display  Imaging Review Dg Chest 2 View  09/26/2015  CLINICAL DATA:  49 year old female with cough and congestion for 2 days. Initial encounter. EXAM: CHEST  2 VIEW COMPARISON:  02/25/2010.  FINDINGS: Lung volumes remain normal. Normal cardiac size and mediastinal contours. Visualized tracheal air column is within normal limits. The lungs are clear. No pneumothorax or pleural effusion. No osseous abnormality identified. IMPRESSION: Negative, no acute cardiopulmonary abnormality. Electronically Signed   By: Genevie Ann M.D.   On: 09/26/2015 11:00   I have personally reviewed and evaluated these images and lab results as part of my medical decision-making.   EKG Interpretation None      MDM   Final diagnoses:  Cough    Patient presents with dry cough.  No fevers, myalgias, shortness of breath.  VSS, will give tussionex and obtain CXR.  CXR shows no acute process.  Patient stable for discharge.  Discussed return precautions.  Patient agrees and acknowledges the above plan for discharge.   I personally performed the services described in this documentation, which was scribed in my presence. The recorded information has been reviewed and is accurate.    Gloriann Loan, PA-C 09/26/15 [place], MD 09/27/15 [phone]

## 2015-09-26 NOTE — ED Notes (Signed)
Pt states she has had a "real bad cough" since yesterday. Pt states she had some tessalon pearls left from a previous visit that didn't help alleviate sx.

## 2015-09-26 NOTE — ED Notes (Signed)
Pt is in stable condition upon d/c and ambulates from ED. 

## 2018-07-26 ENCOUNTER — Encounter (HOSPITAL_COMMUNITY): Payer: Self-pay

## 2018-07-26 ENCOUNTER — Ambulatory Visit (HOSPITAL_COMMUNITY)
Admission: EM | Admit: 2018-07-26 | Discharge: 2018-07-26 | Disposition: A | Discharge status: Home / Self Care | Payer: Medicaid Other | Attending: Family Medicine | Admitting: Family Medicine

## 2018-07-26 DIAGNOSIS — R350 Frequency of micturition: Secondary | ICD-10-CM

## 2018-07-26 DIAGNOSIS — R300 Dysuria: Secondary | ICD-10-CM

## 2018-07-26 LAB — POCT URINALYSIS DIP (DEVICE)
Bilirubin Urine: NEGATIVE
Glucose, UA: NEGATIVE mg/dL
Hgb urine dipstick: NEGATIVE
Ketones, ur: NEGATIVE mg/dL
Leukocytes, UA: NEGATIVE
Nitrite: NEGATIVE
Protein, ur: NEGATIVE mg/dL
Specific Gravity, Urine: 1.025 (ref 1.005–1.030)
Urobilinogen, UA: 0.2 mg/dL (ref 0.0–1.0)
pH: 6 (ref 5.0–8.0)

## 2018-07-26 MED ORDER — PHENAZOPYRIDINE HCL 200 MG PO TABS: 200.0000 mg | ORAL_TABLET | Freq: Three times a day (TID) | ORAL | 0 refills | Status: DC

## 2018-07-26 NOTE — Discharge Instructions (Signed)
Drink plenty of water to empty bladder regularly. Avoid alcohol and caffeine as these may irritate the bladder.   May try Pyridium to see if this provides comfort with urination. May turn urine darker orange color.  Please follow up with your primary care provider in the next 1-2 weeks for recheck if symptoms persist.  If develop increased pain, fevers, blood in urine, difficulty or painful urination please return to be seen.

## 2018-07-26 NOTE — ED Provider Notes (Signed)
[place]    CSN: [id number] Arrival date & time: 07/26/18  2637     History   Chief Complaint Chief Complaint  Patient presents with  . Urinary Tract Infection    HPI Jasmin Collins is a 52 y.o. female.   Kaija presents with complaints of urinary frequency and sensation of needing to void with only small amount of output, some throbbing cramping pain with urination which started approximately 3 days ago. Was worse last night. No fevers, chills, abdominal pain or back pain. Denies vaginal symptoms, no discharge, bleeding, itching. She is postmenopausal. Denies any previous similar. Drinks occasional caffeine, does not drink alcohol. States does not drink much water. Does not take any medications, no medical history.     ROS per HPI.      History reviewed. No pertinent past medical history.  There are no active problems to display for this patient.   History reviewed. No pertinent surgical history.  OB History   None      Home Medications    Prior to Admission medications   Medication Sig Start Date End Date Taking? Authorizing Provider  benzonatate (TESSALON) 100 MG capsule Take 1 capsule (100 mg total) by mouth every 8 (eight) hours. 06/22/15   Larene Pickett, PA-C  chlorpheniramine-HYDROcodone (TUSSIONEX PENNKINETIC ER) 10-8 MG/5ML SUER Take 5 mLs by mouth at bedtime as needed for cough. 09/26/15   Gloriann Loan, PA-C  ibuprofen (ADVIL,MOTRIN) 200 MG tablet Take 400 mg by mouth every 6 (six) hours as needed for moderate pain.    [provider]  ibuprofen (ADVIL,MOTRIN) 800 MG tablet Take 1 tablet (800 mg total) by mouth 3 (three) times daily. 05/21/15   West Pugh, NP  phenazopyridine (PYRIDIUM) 200 MG tablet Take 1 tablet (200 mg total) by mouth 3 (three) times daily. 07/26/18   Zigmund Gottron, NP    Family History History reviewed. No pertinent family history.  Social History Social History   Tobacco Use  . Smoking  status: Never Smoker  Substance Use Topics  . Alcohol use: No  . Drug use: No     Allergies   Patient has no known allergies.   Review of Systems Review of Systems   Physical Exam Triage Vital Signs ED Triage Vitals  Enc Vitals Group     BP 07/26/18 0851 125/70     Pulse Rate 07/26/18 0851 63     Resp 07/26/18 0851 16     Temp 07/26/18 0851 98.6 F (37 C)     Temp Source 07/26/18 0851 Oral     SpO2 07/26/18 0851 98 %     Weight --      Height --      Head Circumference --      Peak Flow --      Pain Score 07/26/18 0854 6     Pain Loc --      Pain Edu? --      Excl. in [place]? --    No data found.  Updated Vital Signs BP 125/70 (BP Location: Right Arm)   Pulse 63   Temp 98.6 F (37 C) (Oral)   Resp 16   LMP  (LMP Unknown)   SpO2 98%    Physical Exam  Constitutional: She is oriented to person, place, and time. She appears well-developed and well-nourished. No distress.  Cardiovascular: Normal rate, regular rhythm and normal heart sounds.  Pulmonary/Chest: Effort normal and breath sounds normal.  Abdominal: Soft. There  is tenderness in the suprapubic area. There is no rigidity, no rebound, no guarding, no CVA tenderness, no tenderness at McBurney's point and negative Murphy's sign.  Very mild suprapubic pressure on palpation  Neurological: She is alert and oriented to person, place, and time.  Skin: Skin is warm and dry.     UC Treatments / Results  Labs (all labs ordered are listed, but only abnormal results are displayed) Labs Reviewed  POCT URINALYSIS DIP (DEVICE)    EKG None  Radiology No results found.  Procedures Procedures (including critical care time)  Medications Ordered in UC Medications - No data to display  Initial Impression / Assessment and Plan / UC Course  I have reviewed the triage vital signs and the nursing notes.  Pertinent labs & imaging results that were available during my care of the patient were reviewed by me and  considered in my medical decision making (see chart for details).     Non toxic in appearance, afebrile. Mild suprapubic tenderness on exam, no cva tenderness. Afebrile. No glucose, blood, leuks or nitrite to urine. UA completely WNL. Discussed increasing fluid intake, limit caffeine. Follow up with PCP for recheck in the next 1-2 weeks. Return precautions provided. Patient verbalized understanding and agreeable to plan.  Ambulatory out of clinic without difficulty.    Final Clinical Impressions(s) / UC Diagnoses   Final diagnoses:  Urinary frequency  Dysuria     Discharge Instructions     Drink plenty of water to empty bladder regularly. Avoid alcohol and caffeine as these may irritate the bladder.   May try Pyridium to see if this provides comfort with urination. May turn urine darker orange color.  Please follow up with your primary care provider in the next 1-2 weeks for recheck if symptoms persist.  If develop increased pain, fevers, blood in urine, difficulty or painful urination please return to be seen.    ED Prescriptions    Medication Sig Dispense Auth. Provider   phenazopyridine (PYRIDIUM) 200 MG tablet Take 1 tablet (200 mg total) by mouth 3 (three) times daily. 6 tablet Zigmund Gottron, NP     Controlled Substance Prescriptions [place] Controlled Substance Registry consulted? Not Applicable   Zigmund Gottron, NP 07/26/18 0930

## 2018-07-26 NOTE — ED Triage Notes (Signed)
Pt presents with urinary tract symptoms; urge to urinate frequently, seeming like bladder is always full, slight discomfort when urinating and mild cramping.

## 2019-02-25 ENCOUNTER — Other Ambulatory Visit: Payer: Self-pay

## 2019-02-25 ENCOUNTER — Encounter (HOSPITAL_COMMUNITY): Payer: Self-pay | Admitting: Emergency Medicine

## 2019-02-25 ENCOUNTER — Ambulatory Visit (HOSPITAL_COMMUNITY)
Admission: EM | Admit: 2019-02-25 | Discharge: 2019-02-25 | Disposition: A | Discharge status: Home / Self Care | Payer: Medicaid Other | Attending: Family Medicine | Admitting: Family Medicine

## 2019-02-25 DIAGNOSIS — L0291 Cutaneous abscess, unspecified: Secondary | ICD-10-CM

## 2019-02-25 MED ORDER — SULFAMETHOXAZOLE-TRIMETHOPRIM 800-160 MG PO TABS: 1.0000 | ORAL_TABLET | Freq: Two times a day (BID) | ORAL | 0 refills | Status: AC

## 2019-02-25 NOTE — ED Triage Notes (Signed)
Pt reports an abscess to her right upper back that she first noticed on Saturday.  Pt denies any fever.

## 2019-02-25 NOTE — Discharge Instructions (Signed)
We will treat the skin infection with antibiotics.  You  will take the Bactrim twice a day for 7 days Keep doing warm compresses Follow up as needed for continued or worsening symptoms

## 2019-02-28 NOTE — ED Provider Notes (Signed)
[place]    CSN: [id number] Arrival date & time: 02/25/19  0825     History   Chief Complaint Chief Complaint  Patient presents with  . Abscess    HPI Jasmin Collins is a 53 y.o. female.   Patient is a 53 year old female presents today with abscess to upper back area.  This is been present and worsening since this past Saturday.  She denies any drainage from the area.  She has been doing warm compresses on the area.  No drainage.  Denies any fevers, chills, body aches, night sweats.  Patient does have a history of MRSA.  ROS per HPI    Abscess    History reviewed. No pertinent past medical history.  There are no active problems to display for this patient.   History reviewed. No pertinent surgical history.  OB History   No obstetric history on file.      Home Medications    Prior to Admission medications   Medication Sig Start Date End Date Taking? Authorizing Provider  benzonatate (TESSALON) 100 MG capsule Take 1 capsule (100 mg total) by mouth every 8 (eight) hours. 06/22/15   Larene Pickett, PA-C  chlorpheniramine-HYDROcodone (TUSSIONEX PENNKINETIC ER) 10-8 MG/5ML SUER Take 5 mLs by mouth at bedtime as needed for cough. 09/26/15   Gloriann Loan, PA-C  ibuprofen (ADVIL,MOTRIN) 200 MG tablet Take 400 mg by mouth every 6 (six) hours as needed for moderate pain.    [provider]  ibuprofen (ADVIL,MOTRIN) 800 MG tablet Take 1 tablet (800 mg total) by mouth 3 (three) times daily. 05/21/15   West Pugh, NP  phenazopyridine (PYRIDIUM) 200 MG tablet Take 1 tablet (200 mg total) by mouth 3 (three) times daily. 07/26/18   Zigmund Gottron, NP  sulfamethoxazole-trimethoprim (BACTRIM DS,SEPTRA DS) 800-160 MG tablet Take 1 tablet by mouth 2 (two) times daily for 7 days. 02/25/19 03/04/19  Orvan July, NP    Family History No family history on file.  Social History Social History   Tobacco Use  . Smoking status: Never Smoker  . Smokeless  tobacco: Never Used  Substance Use Topics  . Alcohol use: No  . Drug use: No     Allergies   Patient has no known allergies.   Review of Systems Review of Systems   Physical Exam Triage Vital Signs ED Triage Vitals  Enc Vitals Group     BP 02/25/19 0836 136/90     Pulse Rate 02/25/19 0836 (!) 57     Resp --      Temp 02/25/19 0836 98.7 F (37.1 C)     Temp Source 02/25/19 0836 Oral     SpO2 02/25/19 0836 100 %     Weight --      Height --      Head Circumference --      Peak Flow --      Pain Score 02/25/19 0834 8     Pain Loc --      Pain Edu? --      Excl. in [place]? --    No data found.  Updated Vital Signs BP 136/90 (BP Location: Left Arm)   Pulse (!) 57   Temp 98.7 F (37.1 C) (Oral)   SpO2 100%   Visual Acuity Right Eye Distance:   Left Eye Distance:   Bilateral Distance:    Right Eye Near:   Left Eye Near:    Bilateral Near:  Physical Exam Vitals signs and nursing note reviewed.  Constitutional:      General: She is not in acute distress.    Appearance: Normal appearance. She is not ill-appearing, toxic-appearing or diaphoretic.  HENT:     Head: Normocephalic.     Nose: Nose normal.     Mouth/Throat:     Pharynx: Oropharynx is clear.  Eyes:     Conjunctiva/sclera: Conjunctivae normal.  Neck:     Musculoskeletal: Normal range of motion.  Pulmonary:     Effort: Pulmonary effort is normal.  Musculoskeletal: Normal range of motion.  Skin:    General: Skin is warm and dry.     Findings: No rash.          Comments: Abscess noted to upper back area.  Indurated without fluctuance.  Approximated 3-1/2 to 4 cm x 2 cm.  Neurological:     Mental Status: She is alert.  Psychiatric:        Mood and Affect: Mood normal.      UC Treatments / Results  Labs (all labs ordered are listed, but only abnormal results are displayed) Labs Reviewed - No data to display  EKG None  Radiology No results found.  Procedures Procedures (including  critical care time)  Medications Ordered in UC Medications - No data to display  Initial Impression / Assessment and Plan / UC Course  I have reviewed the triage vital signs and the nursing notes.  Pertinent labs & imaging results that were available during my care of the patient were reviewed by me and considered in my medical decision making (see chart for details).     No indication for I&D today. Will treat with Bactrim due to MRSA history Instructed to continue with the warm compresses and follow-up if the abscess worsens or does not improve.  Final Clinical Impressions(s) / UC Diagnoses   Final diagnoses:  Abscess     Discharge Instructions     We will treat the skin infection with antibiotics.  You  will take the Bactrim twice a day for 7 days Keep doing warm compresses Follow up as needed for continued or worsening symptoms     ED Prescriptions    Medication Sig Dispense Auth. Provider   sulfamethoxazole-trimethoprim (BACTRIM DS,SEPTRA DS) 800-160 MG tablet Take 1 tablet by mouth 2 (two) times daily for 7 days. 14 tablet Loura Halt A, NP     Controlled Substance Prescriptions [place] Controlled Substance Registry consulted? No   Orvan July, NP 02/28/19 1045

## 2019-05-03 ENCOUNTER — Other Ambulatory Visit: Payer: Self-pay

## 2019-05-03 ENCOUNTER — Encounter (HOSPITAL_COMMUNITY): Payer: Self-pay | Admitting: *Deleted

## 2019-05-03 ENCOUNTER — Emergency Department (HOSPITAL_COMMUNITY)
Admission: EM | Admit: 2019-05-03 | Discharge: 2019-05-03 | Disposition: A | Discharge status: Home / Self Care | Payer: Medicaid Other | Attending: Emergency Medicine | Admitting: Emergency Medicine

## 2019-05-03 ENCOUNTER — Emergency Department (HOSPITAL_COMMUNITY): Payer: Medicaid Other

## 2019-05-03 DIAGNOSIS — K219 Gastro-esophageal reflux disease without esophagitis: Secondary | ICD-10-CM | POA: Insufficient documentation

## 2019-05-03 DIAGNOSIS — R450 Nervousness: Secondary | ICD-10-CM | POA: Insufficient documentation

## 2019-05-03 DIAGNOSIS — R0789 Other chest pain: Secondary | ICD-10-CM | POA: Diagnosis not present

## 2019-05-03 LAB — BASIC METABOLIC PANEL
Anion gap: 9 (ref 5–15)
BUN: 20 mg/dL (ref 6–20)
CO2: 27 mmol/L (ref 22–32)
Calcium: 10 mg/dL (ref 8.9–10.3)
Chloride: 105 mmol/L (ref 98–111)
Creatinine, Ser: 0.67 mg/dL (ref 0.44–1.00)
GFR calc Af Amer: >60 mL/min (ref >60)
GFR calc non Af Amer: >60 mL/min (ref >60)
Glucose, Bld: 137 mg/dL — ABNORMAL HIGH (ref 70–99)
Potassium: 3.7 mmol/L (ref 3.5–5.1)
Sodium: 141 mmol/L (ref 135–145)

## 2019-05-03 LAB — CBC
HCT: 40.1 % (ref 36.0–46.0)
Hemoglobin: 12.9 g/dL (ref 12.0–15.0)
MCH: 30.7 pg (ref 26.0–34.0)
MCHC: 32.2 g/dL (ref 30.0–36.0)
MCV: 95.5 fL (ref 80.0–100.0)
Platelets: 249 10*3/uL (ref 150–400)
RBC: 4.2 MIL/uL (ref 3.87–5.11)
RDW: 13.6 % (ref 11.5–15.5)
WBC: 6.9 10*3/uL (ref 4.0–10.5)
nRBC: 0 % (ref 0.0–0.2)

## 2019-05-03 LAB — TROPONIN I
Troponin I: <0.03 ng/mL (ref <0.03)
Troponin I: <0.03 ng/mL (ref <0.03)

## 2019-05-03 LAB — I-STAT BETA HCG BLOOD, ED (MC, WL, AP ONLY): I-stat hCG, quantitative: <5 m[IU]/mL (ref <5)

## 2019-05-03 MED ORDER — SODIUM CHLORIDE 0.9% FLUSH: 3.0000 mL | Freq: Once | INTRAVENOUS | Status: DC

## 2019-05-03 NOTE — Discharge Instructions (Addendum)
For your acid reflux she may take Pepcid, Prilosec, or Nexium over-the-counter.

## 2019-05-03 NOTE — ED Provider Notes (Signed)
[facility] EMERGENCY DEPARTMENT Provider Note  CSN: [id number] Arrival date & time: 05/03/19 0120  Chief Complaint(s) Palpitations and Chest Pain  HPI Jasmin Collins is a 53 y.o. female with a reported history of acid reflux who presents to the emergency department with sensation of jitteriness.  She reports that this began after taking ibuprofen for indigestion.  She reports earlier this afternoon she had indigestion that is consistent with her prior episodes.  She attempted to take Pepto-Bismol but had no relief.  Several hours later, around 9 or 10 PM, she took the ibuprofen.  That is when she began to feel jittery.  Around the same time she also took Pepto-Bismol which seemed to resolve her indigestion.  She had no associated shortness of breath, chest pain, nausea, vomiting, diaphoresis.  Symptoms were nonexertional.  Patient denied any recent fevers, infections, cough, congestion.  No abdominal pain.  No other physical symptoms.  Patient is now asymptomatic.  HPI  Past Medical History History reviewed. No pertinent past medical history. There are no active problems to display for this patient.  Home Medication(s) Prior to Admission medications   Medication Sig Start Date End Date Taking? Authorizing Provider  benzonatate (TESSALON) 100 MG capsule Take 1 capsule (100 mg total) by mouth every 8 (eight) hours. Patient not taking: Reported on 05/03/2019 06/22/15   Larene Pickett, PA-C  chlorpheniramine-HYDROcodone [facility] ER) 10-8 MG/5ML SUER Take 5 mLs by mouth at bedtime as needed for cough. Patient not taking: Reported on 05/03/2019 09/26/15   Gloriann Loan, PA-C  ibuprofen (ADVIL,MOTRIN) 800 MG tablet Take 1 tablet (800 mg total) by mouth 3 (three) times daily. Patient not taking: Reported on 05/03/2019 05/21/15   West Pugh, NP  phenazopyridine (PYRIDIUM) 200 MG tablet Take 1 tablet (200 mg total) by mouth 3 (three) times daily. Patient not  taking: Reported on 05/03/2019 07/26/18   Zigmund Gottron, NP                                                                                                                                    Past Surgical History History reviewed. No pertinent surgical history. Family History No family history on file.  Social History Social History   Tobacco Use  . Smoking status: Never Smoker  . Smokeless tobacco: Never Used  Substance Use Topics  . Alcohol use: No  . Drug use: No   Allergies Patient has no known allergies.  Review of Systems Review of Systems All other systems are reviewed and are negative for acute change except as noted in the HPI  Physical Exam Vital Signs  I have reviewed the triage vital signs BP 130/84   Pulse 63   Temp 98.5 F (36.9 C) (Oral)   Resp 19   SpO2 100%   Physical Exam Vitals signs reviewed.  Constitutional:      General: She is not in acute  distress.    Appearance: She is well-developed. She is not diaphoretic.  HENT:     Head: Normocephalic and atraumatic.     Nose: Nose normal.  Eyes:     General: No scleral icterus.       Right eye: No discharge.        Left eye: No discharge.     Conjunctiva/sclera: Conjunctivae normal.     Pupils: Pupils are equal, round, and reactive to light.  Neck:     Musculoskeletal: Normal range of motion and neck supple.  Cardiovascular:     Rate and Rhythm: Normal rate and regular rhythm.     Heart sounds: No murmur. No friction rub. No gallop.   Pulmonary:     Effort: Pulmonary effort is normal. No respiratory distress.     Breath sounds: Normal breath sounds. No stridor. No rales.  Abdominal:     General: There is no distension.     Palpations: Abdomen is soft.     Tenderness: There is no abdominal tenderness.  Musculoskeletal:        General: No tenderness.  Skin:    General: Skin is warm and dry.     Findings: No erythema or rash.  Neurological:     Mental Status: She is alert and oriented to  person, place, and time.     ED Results and Treatments Labs (all labs ordered are listed, but only abnormal results are displayed) Labs Reviewed  BASIC METABOLIC PANEL - Abnormal; Notable for the following components:      Result Value   Glucose, Bld 137 (*)    All other components within normal limits  CBC  TROPONIN I  TROPONIN I  I-STAT BETA HCG BLOOD, ED ([facility], [facility], [facility] ONLY)                                                                                                                         EKG  EKG Interpretation  Date/Time:  Tuesday May 03 2019 02:55:01 EDT Ventricular Rate:  71 PR Interval:  154 QRS Duration: 109 QT Interval:  372 QTC Calculation: 405 R Axis:   26 Text Interpretation:  Sinus rhythm RSR' in V1 or V2, right VCD or RVH Minimal ST elevation, inferior leads When compared with ECG of EARLIER SAME DATE No significant change was found Reconfirmed by Addison Lank [phone]) on 05/03/2019 7:39:39 AM      Radiology Dg Chest 2 View  Result Date: 05/03/2019 CLINICAL DATA:  53 year old female with palpitation. EXAM: CHEST - 2 VIEW COMPARISON:  None. FINDINGS: The heart size and mediastinal contours are within normal limits. Both lungs are clear. The visualized skeletal structures are unremarkable. IMPRESSION: No active cardiopulmonary disease. Electronically Signed   By: Anner Crete M.D.   On: 05/03/2019 02:02   Pertinent labs & imaging results that were available during my care of the patient were reviewed by me and considered in my medical decision making (see chart for details).  Medications Ordered in ED Medications  sodium chloride flush (NS) 0.9 % injection 3 mL (has no administration in time range)                                                                                                                                    Procedures Procedures  (including critical care time)  Medical Decision Making / ED Course I have reviewed the nursing notes  for this encounter and the patient's prior records (if available in EHR or on provided paperwork).    Patient presents with symptoms of jitteriness and indigestion.  Patient is now asymptomatic.  EKG reassuring, nonischemic and without evidence of pericarditis.  Troponins x2-.  Doubt cardiac etiology.  Rest of the labs are grossly reassuring without significant electrolyte derangements or renal insufficiency.  No leukocytosis or anemia.  Abdomen was benign on exam.  Low suspicion for serious intra-abdominal inflammatory/infectious process requiring further work-up or imaging at this time.  The patient appears reasonably screened and/or stabilized for discharge and I doubt any other medical condition or other [facility] requiring further screening, evaluation, or treatment in the ED at this time prior to discharge.  The patient is safe for discharge with strict return precautions.   Final Clinical Impression(s) / ED Diagnoses Final diagnoses:  Jittery feeling  Gastroesophageal reflux disease, esophagitis presence not specified  Atypical chest pain    Disposition: Discharge  Condition: Good  I have discussed the results, Dx and Tx plan with the patient who expressed understanding and agree(s) with the plan. Discharge instructions discussed at great length. The patient was given strict return precautions who verbalized understanding of the instructions. No further questions at time of discharge.    ED Discharge Orders    None       Follow Up: Primary care provider  Schedule an appointment as soon as possible for a visit       This chart was dictated using voice recognition software.  Despite best efforts to proofread,  errors can occur which can change the documentation meaning.   Fatima Blank, MD 05/03/19 [phone]

## 2019-05-03 NOTE — ED Triage Notes (Signed)
Pt arrives to lobby with c/o "feeling very jittery" for about 30 minutes PTA. Initially, very anxious, brought back to triage and seemed calmer. Says she does not normally take ibuprofen but she did tonight. Pt says that she has had indigestion all day, not relieved by Pepto Bismol.

## 2019-06-19 ENCOUNTER — Encounter (HOSPITAL_COMMUNITY): Payer: Self-pay | Admitting: Emergency Medicine

## 2019-06-19 ENCOUNTER — Ambulatory Visit (HOSPITAL_COMMUNITY)
Admission: EM | Admit: 2019-06-19 | Discharge: 2019-06-19 | Disposition: A | Discharge status: Home / Self Care | Payer: Medicaid Other | Attending: Family Medicine | Admitting: Family Medicine

## 2019-06-19 ENCOUNTER — Other Ambulatory Visit: Payer: Self-pay

## 2019-06-19 DIAGNOSIS — F419 Anxiety disorder, unspecified: Secondary | ICD-10-CM

## 2019-06-19 HISTORY — DX: Anxiety disorder, unspecified: F41.9

## 2019-06-19 MED ORDER — HYDROXYZINE HCL 25 MG PO TABS: 25.0000 mg | ORAL_TABLET | Freq: Four times a day (QID) | ORAL | 0 refills | Status: DC

## 2019-06-19 NOTE — Discharge Instructions (Addendum)
Take the prescribed medication every 6 hours as needed; do not drive, operate machinery, or drink alcohol while taking this medication.    Follow-up as scheduled with your doctor on Wednesday.    Return here or go to the emergency department if your symptoms worsen.

## 2019-06-19 NOTE — ED Triage Notes (Signed)
Pt here for increased anxiety x 1 month; pt sts having increased anxiety today; pt sts she sees someone this week for same; pt denies SI/HI

## 2019-06-19 NOTE — ED Provider Notes (Signed)
[facility]    CSN: [id number] Arrival date & time: 06/19/19  1315     History   Chief Complaint Chief Complaint  Patient presents with  . Anxiety    HPI JONAI WEYLAND is a 53 y.o. female.   Patient presents with intermittent "anxiety attacks" for 1 month.  She was seen in the emergency department for this on 05/03/2019 and her work-up was negative at that time.  She states she has appointment with her primary care provider on 06/22/2019.  She denies suicidal or homicidal ideation.  She states she has multiple stressors at home.  The history is provided by the patient.    Past Medical History:  Diagnosis Date  . Anxiety     There are no active problems to display for this patient.   History reviewed. No pertinent surgical history.  OB History   No obstetric history on file.      Home Medications    Prior to Admission medications   Medication Sig Start Date End Date Taking? Authorizing Provider  benzonatate (TESSALON) 100 MG capsule Take 1 capsule (100 mg total) by mouth every 8 (eight) hours. Patient not taking: Reported on 05/03/2019 06/22/15   Larene Pickett, PA-C  chlorpheniramine-HYDROcodone [facility]) 10-8 MG/5ML SUER Take 5 mLs by mouth at bedtime as needed for cough. Patient not taking: Reported on 05/03/2019 09/26/15   Gloriann Loan, PA-C  hydrOXYzine (ATARAX/VISTARIL) 25 MG tablet Take 1 tablet (25 mg total) by mouth every 6 (six) hours. 06/19/19   Sharion Balloon, NP  ibuprofen (ADVIL,MOTRIN) 800 MG tablet Take 1 tablet (800 mg total) by mouth 3 (three) times daily. Patient not taking: Reported on 05/03/2019 05/21/15   West Pugh, NP  phenazopyridine (PYRIDIUM) 200 MG tablet Take 1 tablet (200 mg total) by mouth 3 (three) times daily. Patient not taking: Reported on 05/03/2019 07/26/18   Zigmund Gottron, NP    Family History History reviewed. No pertinent family history.  Social History Social History   Tobacco Use  . Smoking  status: Never Smoker  . Smokeless tobacco: Never Used  Substance Use Topics  . Alcohol use: No  . Drug use: No     Allergies   Patient has no known allergies.   Review of Systems Review of Systems  Constitutional: Negative for chills and fever.  HENT: Negative for ear pain and sore throat.   Eyes: Negative for pain and visual disturbance.  Respiratory: Negative for cough and shortness of breath.   Cardiovascular: Negative for chest pain and palpitations.  Gastrointestinal: Negative for abdominal pain and vomiting.  Genitourinary: Negative for dysuria and hematuria.  Musculoskeletal: Negative for arthralgias and back pain.  Skin: Negative for color change and rash.  Neurological: Negative for dizziness, seizures, syncope, facial asymmetry, speech difficulty, weakness, light-headedness, numbness and headaches.  Psychiatric/Behavioral: Negative for hallucinations, self-injury and suicidal ideas. The patient is nervous/anxious.   All other systems reviewed and are negative.    Physical Exam Triage Vital Signs ED Triage Vitals [06/19/19 1355]  Enc Vitals Group     BP (!) 143/86     Pulse Rate 78     Resp 18     Temp 98.9 F (37.2 C)     Temp Source Oral     SpO2 100 %     Weight      Height      Head Circumference      Peak Flow  Pain Score 0     Pain Loc      Pain Edu?      Excl. in [place]?    No data found.  Updated Vital Signs BP (!) 143/86 (BP Location: Right Arm)   Pulse 78   Temp 98.9 F (37.2 C) (Oral)   Resp 18   SpO2 100%   Visual Acuity Right Eye Distance:   Left Eye Distance:   Bilateral Distance:    Right Eye Near:   Left Eye Near:    Bilateral Near:     Physical Exam Vitals signs and nursing note reviewed.  Constitutional:      General: She is not in acute distress.    Appearance: She is well-developed.  HENT:     Head: Normocephalic and atraumatic.  Eyes:     Conjunctiva/sclera: Conjunctivae normal.  Neck:     Musculoskeletal:  Neck supple.  Cardiovascular:     Rate and Rhythm: Normal rate and regular rhythm.     Heart sounds: No murmur.  Pulmonary:     Effort: Pulmonary effort is normal. No respiratory distress.     Breath sounds: Normal breath sounds.  Abdominal:     Palpations: Abdomen is soft.     Tenderness: There is no abdominal tenderness.  Musculoskeletal:        General: No deformity or signs of injury.  Skin:    General: Skin is warm and dry.  Neurological:     General: No focal deficit present.     Mental Status: She is alert and oriented to person, place, and time.     Sensory: No sensory deficit.     Motor: No weakness.     Coordination: Coordination normal.     Gait: Gait normal.     Deep Tendon Reflexes: Reflexes normal.  Psychiatric:        Mood and Affect: Mood normal.        Behavior: Behavior normal.      UC Treatments / Results  Labs (all labs ordered are listed, but only abnormal results are displayed) Labs Reviewed - No data to display  EKG   Radiology No results found.  Procedures Procedures (including critical care time)  Medications Ordered in UC Medications - No data to display  Initial Impression / Assessment and Plan / UC Course  I have reviewed the triage vital signs and the nursing notes.  Pertinent labs & imaging results that were available during my care of the patient were reviewed by me and considered in my medical decision making (see chart for details).   Anxiety.  Patient denies suicidal or homicidal ideation.  Treating today with short-term hydroxyzine until patient can be seen by her PCP on 06/22/2019.  Discussed with patient that she should go to the emergency department if her anxiety worsens or if she has feelings of self-harm or harming others.     Final Clinical Impressions(s) / UC Diagnoses   Final diagnoses:  Anxiety     Discharge Instructions     Take the prescribed medication every 6 hours as needed; do not drive, operate  machinery, or drink alcohol while taking this medication.    Follow-up as scheduled with your doctor on Wednesday.    Return here or go to the emergency department if your symptoms worsen.        ED Prescriptions    Medication Sig Dispense Auth. Provider   hydrOXYzine (ATARAX/VISTARIL) 25 MG tablet Take 1 tablet (25 mg total) by  mouth every 6 (six) hours. 12 tablet Sharion Balloon, NP     Controlled Substance Prescriptions [place] Controlled Substance Registry consulted? Yes, I have consulted the [place] Controlled Substances Registry for this patient, and feel the risk/benefit ratio today is favorable for proceeding with this prescription for a controlled substance.   Sharion Balloon, NP 06/19/19 1455

## 2020-09-11 ENCOUNTER — Other Ambulatory Visit: Payer: Self-pay

## 2020-09-11 ENCOUNTER — Ambulatory Visit
Admission: EM | Admit: 2020-09-11 | Discharge: 2020-09-11 | Disposition: A | Discharge status: Home / Self Care | Payer: Medicaid Other

## 2020-09-11 DIAGNOSIS — F411 Generalized anxiety disorder: Secondary | ICD-10-CM

## 2020-09-11 DIAGNOSIS — M5442 Lumbago with sciatica, left side: Secondary | ICD-10-CM

## 2020-09-11 DIAGNOSIS — M5441 Lumbago with sciatica, right side: Secondary | ICD-10-CM

## 2020-09-11 MED ORDER — MELOXICAM 15 MG PO TABS: 15.0000 mg | ORAL_TABLET | Freq: Every day | ORAL | 0 refills | Status: DC

## 2020-09-11 MED ORDER — KETOROLAC TROMETHAMINE 30 MG/ML IJ SOLN
30.0000 mg | Freq: Once | INTRAMUSCULAR | Status: AC
  Administered 2020-09-11: 30 mg via INTRAMUSCULAR

## 2020-09-11 MED ORDER — DEXAMETHASONE SODIUM PHOSPHATE 10 MG/ML IJ SOLN
10.0000 mg | Freq: Once | INTRAMUSCULAR | Status: AC
  Administered 2020-09-11: 10 mg via INTRAMUSCULAR

## 2020-09-11 MED ORDER — CITALOPRAM HYDROBROMIDE 40 MG PO TABS: 40.0000 mg | ORAL_TABLET | Freq: Every day | ORAL | 0 refills | Status: DC

## 2020-09-11 MED ORDER — TIZANIDINE HCL 4 MG PO TABS: 4.0000 mg | ORAL_TABLET | Freq: Every day | ORAL | 0 refills | Status: DC

## 2020-09-11 NOTE — ED Provider Notes (Signed)
[facility]    CSN: [id number] Arrival date & time: 09/11/20  1355      History   Chief Complaint Chief Complaint  Patient presents with  . Back Pain    HPI Jasmin Collins is a 54 y.o. female.   HPI  Patient presents today for medication refill of her anxiety medicine.  She reports making several attempts to contact her primary care provider at [facility] and has been unable to obtain a refill of her Celexa.  Reports she is taking medication for generalized anxiety disorder.  She also is here for evaluation of lumbar sacrum pain that has been sharp intermittently over the last few days.  She denies any radiation into her lower legs or buttocks.  No history of chronic back pain.  She has been taking over-the-counter medication without resolution of pain.  Past Medical History:  Diagnosis Date  . Anxiety     There are no problems to display for this patient.   History reviewed. No pertinent surgical history.  OB History   No obstetric history on file.      Home Medications    Prior to Admission medications   Medication Sig Start Date End Date Taking? Authorizing Provider  citalopram (CELEXA) 40 MG tablet Take 40 mg by mouth daily.   Yes [provider]    Family History History reviewed. No pertinent family history.  Social History Social History   Tobacco Use  . Smoking status: Never Smoker  . Smokeless tobacco: Never Used  Substance Use Topics  . Alcohol use: No  . Drug use: No     Allergies   Patient has no known allergies.   Review of Systems Review of Systems Pertinent negatives listed in HPI  Physical Exam Triage Vital Signs ED Triage Vitals  Enc Vitals Group     BP 09/11/20 1612 128/89     Pulse Rate 09/11/20 1612 95     Resp 09/11/20 1612 18     Temp 09/11/20 1612 98.4 F (36.9 C)     Temp Source 09/11/20 1612 Oral     SpO2 09/11/20 1612 98 %     Weight --      Height --      Head  Circumference --      Peak Flow --      Pain Score 09/11/20 1613 7     Pain Loc --      Pain Edu? --      Excl. in [place]? --    No data found.  Updated Vital Signs BP 128/89 (BP Location: Left Arm)   Pulse 95   Temp 98.4 F (36.9 C) (Oral)   Resp 18   SpO2 98%   Visual Acuity Right Eye Distance:   Left Eye Distance:   Bilateral Distance:    Right Eye Near:   Left Eye Near:    Bilateral Near:     Physical Exam General appearance: alert, obese, cooperative and in no distress Head: Normocephalic, without obvious abnormality, atraumatic Respiratory: Respirations even and unlabored, normal respiratory rate Heart: rate and rhythm normal. No gallop or murmurs noted on exam  Back: Full ROM, negative straight leg raises, palpable full reproducible pain in the lumbar sacral region no deformity noted.  No gross deformities Skin: Skin color, texture, turgor normal. No rashes seen  Psych: Appropriate mood and affect. UC Treatments / Results  Labs (all labs ordered are listed, but only abnormal results are displayed)  Labs Reviewed - No data to display  EKG   Radiology No results found.  Procedures Procedures (including critical care time)  Medications Ordered in UC Medications  ketorolac (TORADOL) 30 MG/ML injection 30 mg (30 mg Intramuscular Given 09/11/20 1650)  dexamethasone (DECADRON) injection 10 mg (10 mg Intramuscular Given 09/11/20 1650)    Initial Impression / Assessment and Plan / UC Course  I have reviewed the triage vital signs and the nursing notes.  Pertinent labs & imaging results that were available during my care of the patient were reviewed by me and considered in my medical decision making (see chart for details).     Refilled Celexa.  Patient is currently in the process of reestablishing with  family medicine.  Treating for bilateral lower back pain consistent with sciatica.  Patient received Toradol and Decadron here in clinic today. Will  prescribe Mobic and Zanaflex. Final Clinical Impressions(s) / UC Diagnoses   Final diagnoses:  Generalized anxiety disorder  Bilateral low back pain, unspecified chronicity, unspecified whether sciatica present   Discharge Instructions   None    ED Prescriptions    Medication Sig Dispense Auth. Provider   citalopram (CELEXA) 40 MG tablet Take 1 tablet (40 mg total) by mouth daily. 90 tablet Scot Jun, FNP   tiZANidine (ZANAFLEX) 4 MG tablet Take 1 tablet (4 mg total) by mouth at bedtime. 30 tablet Scot Jun, FNP   meloxicam (MOBIC) 15 MG tablet Take 1 tablet (15 mg total) by mouth daily. 30 tablet Scot Jun, FNP     PDMP not reviewed this encounter.   Scot Jun, FNP 09/11/20 1744

## 2020-09-11 NOTE — ED Triage Notes (Signed)
Pt c/o center lower back pain since yesterday while working at [employer]. Denies pain radiating, states pain is burning and sharp. Pt states her anxiety is high and out of her celexa x2wks d/t unable to get in with PCP.

## 2020-10-05 ENCOUNTER — Other Ambulatory Visit: Payer: Self-pay

## 2020-10-05 ENCOUNTER — Encounter: Payer: Self-pay | Admitting: Family Medicine

## 2020-10-05 ENCOUNTER — Ambulatory Visit (INDEPENDENT_AMBULATORY_CARE_PROVIDER_SITE_OTHER): Payer: Medicaid Other | Admitting: Family Medicine

## 2020-10-05 ENCOUNTER — Telehealth: Payer: Self-pay | Admitting: Family Medicine

## 2020-10-05 VITALS — BP 132/88 | HR 89 | Ht 63.0 in | Wt 268.0 lb

## 2020-10-05 DIAGNOSIS — Z6841 Body Mass Index (BMI) 40.0 and over, adult: Secondary | ICD-10-CM

## 2020-10-05 DIAGNOSIS — K219 Gastro-esophageal reflux disease without esophagitis: Secondary | ICD-10-CM

## 2020-10-05 DIAGNOSIS — M545 Low back pain, unspecified: Secondary | ICD-10-CM | POA: Diagnosis not present

## 2020-10-05 DIAGNOSIS — Z6838 Body mass index (BMI) 38.0-38.9, adult: Secondary | ICD-10-CM | POA: Insufficient documentation

## 2020-10-05 DIAGNOSIS — R7309 Other abnormal glucose: Secondary | ICD-10-CM

## 2020-10-05 DIAGNOSIS — E119 Type 2 diabetes mellitus without complications: Secondary | ICD-10-CM

## 2020-10-05 DIAGNOSIS — G479 Sleep disorder, unspecified: Secondary | ICD-10-CM | POA: Diagnosis not present

## 2020-10-05 DIAGNOSIS — E66813 Obesity, class 3: Secondary | ICD-10-CM

## 2020-10-05 LAB — POCT GLYCOSYLATED HEMOGLOBIN (HGB A1C): Hemoglobin A1C: 8.8 % — AB (ref 4.0–5.6)

## 2020-10-05 MED ORDER — METFORMIN HCL ER 500 MG PO TB24: 500.0000 mg | ORAL_TABLET | Freq: Every day | ORAL | 3 refills | Status: DC

## 2020-10-05 MED ORDER — MIRTAZAPINE 30 MG PO TABS: 30.0000 mg | ORAL_TABLET | Freq: Every day | ORAL | 3 refills | Status: DC

## 2020-10-05 MED ORDER — NAPROXEN 500 MG PO TABS: 500.0000 mg | ORAL_TABLET | Freq: Two times a day (BID) | ORAL | 1 refills | Status: DC | PRN

## 2020-10-05 MED ORDER — PANTOPRAZOLE SODIUM 40 MG PO TBEC: 40.0000 mg | DELAYED_RELEASE_TABLET | Freq: Every day | ORAL | 3 refills | Status: DC

## 2020-10-05 NOTE — Assessment & Plan Note (Signed)
Stable on pantoprazole 40 mg. Refilling today

## 2020-10-05 NOTE — Assessment & Plan Note (Addendum)
Lower lumbar/SI back pain, no radiation. Recently treated with steroid injection, tizanidine, meloxicam. Likely due to an acute strain. Negative for red flag signs. -Naproxen 500 mg every 12 hours as needed -If not improving, schedule for appointment with me for osteopathic manipulation

## 2020-10-05 NOTE — Assessment & Plan Note (Signed)
Needs further counseling on diet and exercise/movement.

## 2020-10-05 NOTE — Telephone Encounter (Signed)
Patient was called with results of HbA1c, her reading was 8.8 at her visit. We discussed diet modification and exercise. Patient is also agreeable to starting Metformin, was counseled on risks and benefits. We plan to have her return in 3 months to repeat the lab and assess need for additional medications.

## 2020-10-05 NOTE — Assessment & Plan Note (Signed)
Patient is currently taking Remeron and believes it is helpful.

## 2020-10-05 NOTE — Patient Instructions (Addendum)
It was agreed to meet you today!  Today we discussed the following:  Low back pain: I am prescribing you naproxen, which she can take every 12 hours as needed.  If this pain worsens or does not improve in the next several weeks, please come by my office to schedule an appointment with me.    Labs: Today we are going to get a lipid panel, HbA1c to check for cholesterol and diabetes.  I will call you with the results of these labs and discussed that there for any medications we need to add on.  Healthcare maintenance: -We give you information for scheduling mammogram -We will discuss getting a colonoscopy at the next visit -At next office visit, likely will get a Pap smear   I have refilled the other prescriptions CIs for, please let me know if you need anything else.
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
## 2020-10-05 NOTE — Assessment & Plan Note (Signed)
Patient has elevated glucose on prior labs. Family history of type 2 diabetes, obesity. Due to risk factors, feel appropriate to get a screening HbA1c at this time. -POC HbA1c

## 2020-10-06 LAB — LIPID PANEL
Chol/HDL Ratio: 6.3 ratio — ABNORMAL HIGH (ref 0.0–4.4)
Cholesterol, Total: 225 mg/dL — ABNORMAL HIGH (ref 100–199)
HDL: 36 mg/dL — ABNORMAL LOW (ref >39)
LDL Chol Calc (NIH): 154 mg/dL — ABNORMAL HIGH (ref 0–99)
Triglycerides: 189 mg/dL — ABNORMAL HIGH (ref 0–149)
VLDL Cholesterol Cal: 35 mg/dL (ref 5–40)

## 2020-10-08 ENCOUNTER — Other Ambulatory Visit: Payer: Self-pay | Admitting: Family Medicine

## 2020-10-08 DIAGNOSIS — E785 Hyperlipidemia, unspecified: Secondary | ICD-10-CM

## 2020-10-08 DIAGNOSIS — E1169 Type 2 diabetes mellitus with other specified complication: Secondary | ICD-10-CM

## 2020-10-08 MED ORDER — ATORVASTATIN CALCIUM 20 MG PO TABS: 20.0000 mg | ORAL_TABLET | Freq: Every day | ORAL | 3 refills | Status: DC

## 2020-10-15 ENCOUNTER — Other Ambulatory Visit: Payer: Self-pay | Admitting: Family Medicine

## 2020-10-15 DIAGNOSIS — M545 Low back pain, unspecified: Secondary | ICD-10-CM

## 2020-10-29 ENCOUNTER — Telehealth: Payer: Self-pay

## 2020-10-29 NOTE — Telephone Encounter (Signed)
Patient needs to be seen by another provider hopefully within the next day as I do not have any openings currently. If the pain is worsening, there needs to be further evaluation to determine if imaging and further work-up is necessary. It may also be an option to involve physical therapy if the patient wishes, though that will not be an immediate process. -Please schedule her for a visit within the next 2 days with another provider is possible.  Fleeta Kunde, DO

## 2020-10-29 NOTE — Telephone Encounter (Signed)
Patient calls nurse line regarding worsening back pain. Per chart review, patient was instructed to schedule appointment for osteopathic manipulation if pain was persistent. PCP does not have any appointment openings until 12/28.   Please advise next steps for patient and if she can be scheduled with another provider.   To PCP  Talbot Grumbling, RN

## 2020-10-30 NOTE — Progress Notes (Signed)
    SUBJECTIVE:   CHIEF COMPLAINT / HPI:   Back pain: Patient is a pleasant 54 year old female that presents today to discuss her ongoing back pain. She was previously evaluated on 10/05/2020 as a new patient in our clinic.  During that appointment she discussed the fact that she was previously diagnosed back pain at urgent care and was given a steroid shot which gave her some relief for about 2 weeks.  She was also prescribed meloxicam as well as tizanidine.  At that appointment patient was diagnosed with an acute strain.  Patient states she had not injured her back at work and that it kind of came on over a period of time.  She states that since then she has tried to go back to [employer] but the pain will come on after she has been on her feet 2-3 hours. She enjoyed her job at [employer] but the lifting bothered her back too much. She is currently taking a break from [place]. Denies loss of bowel or bladder function, denies saddle paresthesias.  Denies any pain radiating down her legs. She is interested in physical therapy.  PERTINENT  PMH / PSH: None relevant   OBJECTIVE:   BP 120/66   Pulse 100   Wt 258 lb (117 kg)   SpO2 99%   BMI 45.70 kg/m    General: NAD, pleasant, able to participate in exam Cardiac: RRR, no murmurs. Respiratory: CTAB, normal effort MSK: No midline tenderness though patient does have discomfort to the musculature of the lumbar area lateral to the spine on both the left and the right.  Negative straight leg testing.  Strength testing 5/5 in lower limbs Neuro: alert, no obvious focal deficits Psych: Normal affect and mood  ASSESSMENT/PLAN:   Acute bilateral low back pain without sciatica Assessment: 54 year old female with acute lumbar strain presenting for follow-up.  She has gotten some relief from the meloxicam as well as to this tizanidine.  She works in a position where she has to regularly lift boxes and has since taken some time off of work but has the ability to return.   She request a work note limiting her lifting ability until she can get in with physical therapy.  No red flag symptoms.  Physical exam strongly suggestive of a lumbar strain with no radicular symptoms.  I believe the patient would likely benefit significantly from physical therapy. Plan: -Ambulatory referral to physical therapy placed -Provided patient work note to limit her lifting ability for the next 2 weeks to 15 pounds or less     Lurline Del, [place]    This note was prepared using Dragon voice recognition software and may include unintentional dictation errors due to the inherent limitations of voice recognition software.

## 2020-10-31 ENCOUNTER — Ambulatory Visit (INDEPENDENT_AMBULATORY_CARE_PROVIDER_SITE_OTHER): Payer: Medicaid Other | Admitting: Family Medicine

## 2020-10-31 ENCOUNTER — Encounter: Payer: Self-pay | Admitting: Family Medicine

## 2020-10-31 ENCOUNTER — Other Ambulatory Visit: Payer: Self-pay

## 2020-10-31 VITALS — BP 120/66 | HR 100 | Wt 258.0 lb

## 2020-10-31 DIAGNOSIS — M545 Low back pain, unspecified: Secondary | ICD-10-CM | POA: Diagnosis not present

## 2020-10-31 NOTE — Patient Instructions (Signed)
It was great to see you! Thank you for allowing me to participate in your care!  Our plans for today:  -Today we discussed your back pain.  I believe that physical therapy will make a substantial improvement of your lower back pain.  I have sent a referral for them. 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  Take care and seek immediate care sooner if you develop any concerns.   Dr. Lurline Del, [place]

## 2020-10-31 NOTE — Assessment & Plan Note (Signed)
Assessment: 54 year old female with acute lumbar strain presenting for follow-up.  She has gotten some relief from the meloxicam as well as to this tizanidine.  She works in a position where she has to regularly lift boxes and has since taken some time off of work but has the ability to return.  She request a work note limiting her lifting ability until she can get in with physical therapy.  No red flag symptoms.  Physical exam strongly suggestive of a lumbar strain with no radicular symptoms.  I believe the patient would likely benefit significantly from physical therapy. Plan: -Ambulatory referral to physical therapy placed -Provided patient work note to limit her lifting ability for the next 2 weeks to 15 pounds or less

## 2020-11-11 ENCOUNTER — Other Ambulatory Visit: Payer: Self-pay | Admitting: Family Medicine

## 2020-11-11 DIAGNOSIS — M545 Low back pain, unspecified: Secondary | ICD-10-CM

## 2020-11-16 ENCOUNTER — Other Ambulatory Visit: Payer: Self-pay | Admitting: Family Medicine

## 2020-11-16 DIAGNOSIS — M545 Low back pain, unspecified: Secondary | ICD-10-CM

## 2020-11-21 ENCOUNTER — Ambulatory Visit: Payer: Medicaid Other | Admitting: Physical Therapy

## 2020-11-21 ENCOUNTER — Ambulatory Visit: Payer: Medicaid Other | Admitting: Rehabilitative and Restorative Service Providers"

## 2020-12-05 ENCOUNTER — Other Ambulatory Visit: Payer: Self-pay | Admitting: Family Medicine

## 2020-12-05 DIAGNOSIS — M545 Low back pain, unspecified: Secondary | ICD-10-CM

## 2020-12-08 ENCOUNTER — Other Ambulatory Visit: Payer: Self-pay | Admitting: Family Medicine

## 2020-12-10 ENCOUNTER — Ambulatory Visit: Payer: Medicaid Other | Attending: Family Medicine

## 2020-12-24 ENCOUNTER — Other Ambulatory Visit: Payer: Self-pay | Admitting: Family Medicine

## 2020-12-24 DIAGNOSIS — M545 Low back pain, unspecified: Secondary | ICD-10-CM

## 2020-12-28 NOTE — Telephone Encounter (Signed)
Called patient about request for Naproxen refill, patient states that she does not want this medication. She is still having some issues with back pain but does not need anything at this point. If she continues to have back pain we can consider a one time prescription of Baclofen 10mg  TID with 30 tablets.   Jasmin Stripling, DO

## 2021-01-23 ENCOUNTER — Ambulatory Visit (INDEPENDENT_AMBULATORY_CARE_PROVIDER_SITE_OTHER): Payer: Medicaid Other | Admitting: Family Medicine

## 2021-01-23 ENCOUNTER — Encounter: Payer: Self-pay | Admitting: Family Medicine

## 2021-01-23 ENCOUNTER — Other Ambulatory Visit: Payer: Self-pay

## 2021-01-23 VITALS — BP 108/62 | HR 105 | Wt 239.0 lb

## 2021-01-23 DIAGNOSIS — E785 Hyperlipidemia, unspecified: Secondary | ICD-10-CM

## 2021-01-23 DIAGNOSIS — E1169 Type 2 diabetes mellitus with other specified complication: Secondary | ICD-10-CM

## 2021-01-23 DIAGNOSIS — E119 Type 2 diabetes mellitus without complications: Secondary | ICD-10-CM | POA: Diagnosis not present

## 2021-01-23 DIAGNOSIS — K219 Gastro-esophageal reflux disease without esophagitis: Secondary | ICD-10-CM

## 2021-01-23 DIAGNOSIS — F419 Anxiety disorder, unspecified: Secondary | ICD-10-CM

## 2021-01-23 LAB — POCT UA - MICROALBUMIN
Creatinine, POC: 50 mg/dL
Microalbumin Ur, POC: 10 mg/L

## 2021-01-23 LAB — POCT GLYCOSYLATED HEMOGLOBIN (HGB A1C): Hemoglobin A1C: 12.9 % — AB (ref 4.0–5.6)

## 2021-01-23 MED ORDER — HYDROXYZINE HCL 10 MG PO TABS: 10.0000 mg | ORAL_TABLET | Freq: Three times a day (TID) | ORAL | 0 refills | Status: DC | PRN

## 2021-01-23 MED ORDER — PANTOPRAZOLE SODIUM 40 MG PO TBEC: 40.0000 mg | DELAYED_RELEASE_TABLET | Freq: Every day | ORAL | 3 refills | Status: DC

## 2021-01-23 MED ORDER — METFORMIN HCL ER 500 MG PO TB24: 500.0000 mg | ORAL_TABLET | Freq: Every day | ORAL | 3 refills | Status: DC

## 2021-01-23 MED ORDER — ATORVASTATIN CALCIUM 20 MG PO TABS: 20.0000 mg | ORAL_TABLET | Freq: Every day | ORAL | 3 refills | Status: DC

## 2021-01-23 MED ORDER — CITALOPRAM HYDROBROMIDE 40 MG PO TABS: 40.0000 mg | ORAL_TABLET | Freq: Every day | ORAL | 3 refills | Status: DC

## 2021-01-23 NOTE — Progress Notes (Signed)
SUBJECTIVE:   CHIEF COMPLAINT / HPI:   Anxiety with "panic attacks"  Patient reports that in the last week she has had an onset of panic attacks which she has never had before.  She describes these episodes as feeling "jittery, mouth dryness, shaky legs, short of breath" and that she gets very tired after use and has been taking it.  She states the episodes last 15 minutes and has been having 2/day for the last week.  Denies chest pains with these episodes.  She states that she has been taking citalopram when she is having these panic attacks. Triggers: Appear to be related to grandson's behavior in school.  He was recently moved from behavioral school to middle school and has been having issues with suspension and behavior problems.  Also appears to be other social situations including the father of her children having a different sexual/gender identity (patient was not exactly clear on description of what this change was).  Difficulty sleeping Patient reports that with her increase in anxiety she has been having more difficulty with sleeping.  She states that she wakes up in the middle the night with a "panic attack" and has to sit on the edge of the bed and work on calming herself down.  States that she is currently taking 2-3 Remeron per night and it is not helping very much.  T2DM: Current medications: Metformin 500 mg daily (did not increase dose per instructions). Patient is currently on atorvastatin 20mg . Patient states that she has been drinking a lot of sodas and tea lately.  PERTINENT  PMH / PSH: T2DM, GERD, back pain  OBJECTIVE:   BP 108/62   Pulse (!) 105   Wt 239 lb (108.4 kg)   SpO2 99%   BMI 42.34 kg/m   Gen: well-appearing, appears anxious, tearful in the room at one point Pulm: comfortable on room air, no increased WOB Psych: well-groomed, appears anxious, cried during interview when speaking about the recent social issues.  ASSESSMENT/PLAN:   Anxiety with "panic  attacks" Appears to be related to recent stressors with her grandson switching schools as well as other social situations with her children and the father of her children.  Patient is in dire need of coping mechanisms/strategies especially as it relates to her grandson.  From discussion, it sounds like the grandson would benefit from some behavioral intervention. - Discussed importance of taking citalopram only once daily - Discontinue Remeron - Hydroxyzine 10 mg 3 times daily as needed for anxiety  - Discussed with patient possibility of a need for therapist or someone to help with coping strategies  T2DM A1c today 12.9 (8.8 3 months ago), microalbumin 10.  HbA1c is much elevated from prior, likely related to patient's increase in sugary beverages and worsened by her anxiety state.  The A1c is quite elevated, at this time the patient would likely not benefit from initiation of insulin as I think that it would further worsen her anxiety state and the patient needs much more education about diabetes and dietary changes/modifications before initiation of insulin at this point.  If patient does not comply, it is very possible that she will end up needing to take insulin. - Counseled patient on diabetes diagnosis and dietary modifications - Increased Metformin with instructions to titrate up to 1000 mg twice daily within the next few weeks. - Nutrition referral for diabetic education.  HLD - Refilling atorvastatin 20 mg  GERD - Refilling pantoprazole today  HCM - Pap smear  was in the next few visits - Mammogram to be discussed at later visit - Colonoscopy to be discussed at later visit   Rise Patience, [place]

## 2021-01-23 NOTE — Patient Instructions (Addendum)
It was so great seeing you today! Today we discussed the following:  Anxiety: I am sending in a refill of your citalopram, this is a medication to be taken only once a day (no more than that).  I am also sending you with a medication called hydroxyzine (Atarax/Vistaril) that can be taken up to 3 times a day (but no more than 3) as you need them for anxiety, IBW, lying on this medication but I feel like you might benefit from this at this point.  Diabetes: Your A1c was elevated to 12.9 today, which is much higher than the goal of under 7.  This means in the last 3 months, you have had quite a lot of sugar in your system.  I highly recommend trying to change some of your diet, avoidance of sugary drinks and sweets/carbs/breads will be very helpful.  If you need to have sweet drinks try to make them diet or to switch to water.  I am going to increase your Metformin.  For the next 3 days I want you to take Metformin 500 mg twice a day.  After that, take 2 tablets in the morning and 1 tablet at night for another 3 days.  Then increase to 2 tablets twice a day and continue on that dose.  If you start to have any GI upset or issues with nausea/vomiting/diarrhea, decrease the dose a little bit for a few days and then try to increase it again.       Diabetes Mellitus and Nutrition, Adult When you have diabetes, or diabetes mellitus, it is very important to have healthy eating habits because your blood sugar (glucose) levels are greatly affected by what you eat and drink. Eating healthy foods in the right amounts, at about the same times every day, can help you:  Control your blood glucose.  Lower your risk of heart disease.  Improve your blood pressure.  Reach or maintain a healthy weight. What can affect my meal plan? Every person with diabetes is different, and each person has different needs for a meal plan. Your health care provider may recommend that you work with a dietitian to make a meal plan  that is best for you. Your meal plan may vary depending on factors such as:  The calories you need.  The medicines you take.  Your weight.  Your blood glucose, blood pressure, and cholesterol levels.  Your activity level.  Other health conditions you have, such as heart or kidney disease. How do carbohydrates affect me? Carbohydrates, also called carbs, affect your blood glucose level more than any other type of food. Eating carbs naturally raises the amount of glucose in your blood. Carb counting is a method for keeping track of how many carbs you eat. Counting carbs is important to keep your blood glucose at a healthy level, especially if you use insulin or take certain oral diabetes medicines. It is important to know how many carbs you can safely have in each meal. This is different for every person. Your dietitian can help you calculate how many carbs you should have at each meal and for each snack. How does alcohol affect me? Alcohol can cause a sudden decrease in blood glucose (hypoglycemia), especially if you use insulin or take certain oral diabetes medicines. Hypoglycemia can be a life-threatening condition. Symptoms of hypoglycemia, such as sleepiness, dizziness, and confusion, are similar to symptoms of having too much alcohol.  Do not drink alcohol if: ? Your health care provider tells  you not to drink. ? You are pregnant, may be pregnant, or are planning to become pregnant.  If you drink alcohol: ? Do not drink on an empty stomach. ? Limit how much you use to:  0-1 drink a day for women.  0-2 drinks a day for men. ? Be aware of how much alcohol is in your drink. In the [place] one drink equals one 12 oz bottle of beer (355 mL), one 5 oz glass of wine (148 mL), or one 1 oz glass of hard liquor (44 mL). ? Keep yourself hydrated with water, diet soda, or unsweetened iced tea.  Keep in mind that regular soda, juice, and other mixers may contain a lot of sugar and must be  counted as carbs. What are tips for following this plan? Reading food labels  Start by checking the serving size on the "Nutrition Facts" label of packaged foods and drinks. The amount of calories, carbs, fats, and other nutrients listed on the label is based on one serving of the item. Many items contain more than one serving per package.  Check the total grams (g) of carbs in one serving. You can calculate the number of servings of carbs in one serving by dividing the total carbs by 15. For example, if a food has 30 g of total carbs per serving, it would be equal to 2 servings of carbs.  Check the number of grams (g) of saturated fats and trans fats in one serving. Choose foods that have a low amount or none of these fats.  Check the number of milligrams (mg) of salt (sodium) in one serving. Most people should limit total sodium intake to less than 2,300 mg per day.  Always check the nutrition information of foods labeled as "low-fat" or "nonfat." These foods may be higher in added sugar or refined carbs and should be avoided.  Talk to your dietitian to identify your daily goals for nutrients listed on the label. Shopping  Avoid buying canned, pre-made, or processed foods. These foods tend to be high in fat, sodium, and added sugar.  Shop around the outside edge of the grocery store. This is where you will most often find fresh fruits and vegetables, bulk grains, fresh meats, and fresh dairy. Cooking  Use low-heat cooking methods, such as baking, instead of high-heat cooking methods like deep frying.  Cook using healthy oils, such as olive, canola, or sunflower oil.  Avoid cooking with butter, cream, or high-fat meats. Meal planning  Eat meals and snacks regularly, preferably at the same times every day. Avoid going long periods of time without eating.  Eat foods that are high in fiber, such as fresh fruits, vegetables, beans, and whole grains. Talk with your dietitian about how many  servings of carbs you can eat at each meal.  Eat 4-6 oz (112-168 g) of lean protein each day, such as lean meat, chicken, fish, eggs, or tofu. One ounce (oz) of lean protein is equal to: ? 1 oz (28 g) of meat, chicken, or fish. ? 1 egg. ?  cup (62 g) of tofu.  Eat some foods each day that contain healthy fats, such as avocado, nuts, seeds, and fish.   What foods should I eat? Fruits Berries. Apples. Oranges. Peaches. Apricots. Plums. Grapes. Mango. Papaya. Pomegranate. Kiwi. Cherries. Vegetables Lettuce. Spinach. Leafy greens, including kale, chard, collard greens, and mustard greens. Beets. Cauliflower. Cabbage. Broccoli. Carrots. Green beans. Tomatoes. Peppers. Onions. Cucumbers. Brussels sprouts. Grains Whole grains, such as  whole-wheat or whole-grain bread, crackers, tortillas, cereal, and pasta. Unsweetened oatmeal. Quinoa. Brown or wild rice. Meats and other proteins Seafood. Poultry without skin. Lean cuts of poultry and beef. Tofu. Nuts. Seeds. Dairy Low-fat or fat-free dairy products such as milk, yogurt, and cheese. The items listed above may not be a complete list of foods and beverages you can eat. Contact a dietitian for more information. What foods should I avoid? Fruits Fruits canned with syrup. Vegetables Canned vegetables. Frozen vegetables with butter or cream sauce. Grains Refined white flour and flour products such as bread, pasta, snack foods, and cereals. Avoid all processed foods. Meats and other proteins Fatty cuts of meat. Poultry with skin. Breaded or fried meats. Processed meat. Avoid saturated fats. Dairy Full-fat yogurt, cheese, or milk. Beverages Sweetened drinks, such as soda or iced tea. The items listed above may not be a complete list of foods and beverages you should avoid. Contact a dietitian for more information. Questions to ask a health care provider  Do I need to meet with a diabetes educator?  Do I need to meet with a dietitian?  What  number can I call if I have questions?  When are the best times to check my blood glucose? Where to find more information:  American Diabetes Association: diabetes.org  Academy of Nutrition and Dietetics: www.eatright.[employer] of Diabetes and Digestive and Kidney Diseases: [employer]  Association of Diabetes Care and Education Specialists: www.diabeteseducator.org Summary  It is important to have healthy eating habits because your blood sugar (glucose) levels are greatly affected by what you eat and drink.  A healthy meal plan will help you control your blood glucose and maintain a healthy lifestyle.  Your health care provider may recommend that you work with a dietitian to make a meal plan that is best for you.  Keep in mind that carbohydrates (carbs) and alcohol have immediate effects on your blood glucose levels. It is important to count carbs and to use alcohol carefully. This information is not intended to replace advice given to you by your health care provider. Make sure you discuss any questions you have with your health care provider. Document Revised: 10/25/2019 Document Reviewed: 10/25/2019 Elsevier Patient Education  2021 [place].       Type 2 Diabetes Mellitus, Diagnosis, Adult Type 2 diabetes (type 2 diabetes mellitus) is a long-term disease. It may happen when there is one or both of these problems:  The pancreas does not make enough insulin.  The body does not react in a normal way to insulin that it makes. Insulin lets sugars go into cells in your body. If you have type 2 diabetes, sugars cannot get into your cells. Sugars build up in the blood. This causes high blood sugar. What are the causes? The exact cause of this condition is not known. What increases the risk? The following factors may make you more likely to develop this condition:  Having type 2 diabetes in your family.  Being overweight or very overweight.  Not being  active.  Your body not reacting in a normal way to the insulin it makes.  Having higher than normal blood sugar over time.  Having a type of diabetes when you were pregnant.  Having a condition that causes small fluid-filled sacs on your ovaries. What are the signs or symptoms? At first, you may have no symptoms. You will get symptoms slowly. They may include:  More thirst than normal.  More hunger than normal.  Needing to pee more than normal.  Losing weight without trying.  Feeling tired.  Feeling weak.  Seeing things blurry.  Dark patches on your skin. How is this treated? This condition may be treated by a diabetes expert. You may need to:  Follow an eating plan made by a food expert (dietitian).  Get regular exercise.  Find ways to deal with stress.  Check blood sugar as often as told.  Take medicines. Your doctor will set treatment goals for you. Your blood sugar should be at these levels:  Before meals: 80-130 mg/dL (4.4-7.2 mmol/L).  After meals: below 180 mg/dL (10 mmol/L).  Over the last 2-3 months: less than 7%. Follow these instructions at home: Medicines  Take your diabetes medicines or insulin every day.  Take medicines to help you not get other problems caused by this condition. You may need: ? Aspirin. ? Medicine to lower cholesterol. ? Medicine to control blood pressure. Questions to ask your doctor  Should I meet with a diabetes educator?  What medicines do I need, and when should I take them?  What will I need to treat my condition at home?  When should I check my blood sugar?  Where can I find a support group?  Who can I call if I have questions?  When is my next doctor visit? General instructions  Take over-the-counter and prescription medicines only as told by your doctor.  Keep all follow-up visits as told by your doctor. This is important. Where to find more information  American Diabetes Association (ADA):  www.diabetes.org  American Association of Diabetes Care and Education Specialists (ADCES): www.diabeteseducator.org  International Diabetes Federation (IDF): MemberVerification.ca Contact a doctor if:  Your blood sugar is at or above 240 mg/dL (13.3 mmol/L) for 2 days in a row.  You have been sick for 2 days or more, and you are not getting better.  You have had a fever for 2 days or more, and you are not getting better.  You have any of these problems for more than 6 hours: ? You cannot eat or drink. ? You feel like you may vomit. ? You vomit. ? You have watery poop (diarrhea). Get help right away if:  Your blood sugar is very low. This means it is lower than 54 mg/dL (3 mmol/L).  You feel mixed up (confused).  You have trouble thinking clearly.  You have trouble breathing.  You have medium or large ketone levels in your pee. These symptoms may be an emergency. Do not wait to see if the symptoms will go away. Get medical help right away. Call your local emergency services (911 in the [place] Do not drive yourself to the hospital. Summary  Type 2 diabetes is a long-term disease. Your pancreas may not make enough insulin, or your body may not react in a normal way to insulin that it makes.  This condition is treated with an eating plan, lifestyle changes, and medicines.  Your doctor will set treatment goals for you. These will help you keep your blood sugar in a healthy range.  Keep all follow-up visits as told by your doctor. This is important. This information is not intended to replace advice given to you by your health care provider. Make sure you discuss any questions you have with your health care provider. Document Revised: 06/13/2020 Document Reviewed: 06/13/2020 Elsevier Patient Education  [place].

## 2021-01-28 ENCOUNTER — Other Ambulatory Visit: Payer: Self-pay | Admitting: Family Medicine

## 2021-01-28 DIAGNOSIS — K219 Gastro-esophageal reflux disease without esophagitis: Secondary | ICD-10-CM

## 2021-02-12 ENCOUNTER — Encounter: Payer: Medicaid Other | Attending: Family Medicine

## 2021-02-14 ENCOUNTER — Encounter: Payer: Self-pay | Admitting: Family Medicine

## 2021-02-14 ENCOUNTER — Other Ambulatory Visit: Payer: Self-pay

## 2021-02-14 ENCOUNTER — Ambulatory Visit: Payer: Medicaid Other | Admitting: Family Medicine

## 2021-02-14 VITALS — BP 132/76 | HR 77 | Wt 245.6 lb

## 2021-02-14 DIAGNOSIS — F419 Anxiety disorder, unspecified: Secondary | ICD-10-CM | POA: Insufficient documentation

## 2021-02-14 DIAGNOSIS — E119 Type 2 diabetes mellitus without complications: Secondary | ICD-10-CM | POA: Diagnosis not present

## 2021-02-14 DIAGNOSIS — R7309 Other abnormal glucose: Secondary | ICD-10-CM

## 2021-02-14 LAB — GLUCOSE, POCT (MANUAL RESULT ENTRY): POC Glucose: 118 mg/dl — AB (ref 70–99)

## 2021-02-14 MED ORDER — HYDROXYZINE HCL 25 MG PO TABS: 25.0000 mg | ORAL_TABLET | Freq: Three times a day (TID) | ORAL | 0 refills | Status: DC | PRN

## 2021-02-14 MED ORDER — ACCU-CHEK SOFTCLIX LANCETS MISC
12 refills | Status: DC
Start: 2021-02-14 — End: 2024-08-11

## 2021-02-14 MED ORDER — ACCU-CHEK GUIDE W/DEVICE KIT: 1.0000 | PACK | Freq: Every day | 0 refills | Status: DC

## 2021-02-14 MED ORDER — TRULICITY 0.75 MG/0.5ML ~~LOC~~ SOAJ: 0.7500 mg | SUBCUTANEOUS | 3 refills | Status: DC

## 2021-02-14 MED ORDER — ACCU-CHEK GUIDE VI STRP: ORAL_STRIP | 12 refills | Status: DC

## 2021-02-14 MED ORDER — DULAGLUTIDE 0.75 MG/0.5ML ~~LOC~~ SOAJ: 0.7500 mg | Freq: Once | SUBCUTANEOUS | 0 refills | Status: AC

## 2021-02-14 NOTE — Progress Notes (Signed)
    SUBJECTIVE:   CHIEF COMPLAINT / HPI:   T2DM Patient reports that she has been trying to do well with her diet and has mostly cut out sodas, though she states she has an occasional one and makes sure to drink plenty of water with it as well. She titrated her metformin up to 1000 mg BID and has noticed that she is not urinating as frequently and that her fatigue has improved. She reports that she has the diabetic nutrition classes set to start up next month. Discussed with patient that a GLP1 may be an option to help with weight loss and diabetic control and patient was extremely interested and willing to do the injections. She is also interested in getting the supplies to check her blood sugars at home.   Insomnia Patient reports that she has been taking the hydroxyzine 52m every night but is still waking at 2 am and stays up until about 5-6am. She does note that she may feel a bit less tired but overall has not noted much change.  Anxiety  Panic attacks Patient reports that she has not had any panic attacks in the last several weeks. Her grandson, which was a huge part of her anxiety, is now adjusting much bette to his new school and is doing very well. He is now even participating in football practice and she has noticed a significant change in his behaviors, which has decreased her anxiety and stress.   PERTINENT  PMH / PSH: Difficulty sleeping, obesity, GERD  OBJECTIVE:   BP 132/76   Pulse 77   Wt 245 lb 9.6 oz (111.4 kg)   SpO2 100%   BMI 43.51 kg/m   Gen: well-appearing, NAD CV: RRR, no m/r/g appreciated, no peripheral edema Pulm: CTAB, no wheezes/crackles GI: soft, non-tender, non-distended  ASSESSMENT/PLAN:   T2DM Patient reports compliance with Metformin 10052mBID and diet changes. Has appointment with diabetic education for classes starting next month. Patient expressed interest in GLP1 after discussion of weight loss and high A1c of 12.9 at last visit.   -  Trulicity .0.75 was administered in clinic and patient sent home with sample for next week - Prescription was sent in for Trulicity  - Patient to follow-up in 1 month with blood sugar log if able - HbA1c will be done at the end of May (last done in Feb) - Blood glucose monitoring kit, testing strips, and lancets sent in to pharmacy  Insomnia  Anxiety Patient continues to have issues with insomnia on low dose hydroxyzine 1022m- Increase to 39m72mdroxyzine - Continue to monitor and discuss within the next month - Continue citalopram 40mg89mly     Jammie Clink, DO [place]

## 2021-02-14 NOTE — Patient Instructions (Signed)
It was so great seeing you today! Today your blood sugar when we checked was 118, which is great!!! I am sending in testing supplies for checking your blood sugars at home, the best time to check will be in the morning before you eat anything. We also administered a sample of the medication trulicity in the office and sent you home with another dose to complete next week (on Thursday) as well as sent in the prescription. Remember that this medication is to be taken only once a week and to keep taking your metformin as prescribed. If you have any issues please let me know.    Blood Glucose Monitoring, Adult Monitoring your blood sugar (glucose) is an important part of managing your diabetes. Blood glucose monitoring involves checking your blood glucose as often as directed and keeping a log or record of your results over time. Checking your blood glucose regularly and keeping a blood glucose log can:  Help you and your health care provider adjust your diabetes management plan as needed, including your medicines or insulin.  Help you understand how food, exercise, illnesses, and medicines affect your blood glucose.  Let you know what your blood glucose is at any time. You can quickly find out if you have low blood glucose (hypoglycemia) or high blood glucose (hyperglycemia). Your health care provider will set individualized treatment goals for you. Your goals will be based on your age, other medical conditions you have, and how you respond to diabetes treatment. Generally, the goal of treatment is to maintain the following blood glucose levels:  Before meals (preprandial): 80-130 mg/dL (4.4-7.2 mmol/L).  After meals (postprandial): below 180 mg/dL (10 mmol/L).  A1C level: less than 7%. Supplies needed:  Blood glucose meter.  Test strips for your meter. Each meter has its own strips. You must use the strips that came with your meter.  A needle to prick your finger (lancet). Do not use a lancet  more than one time.  A device that holds the lancet (lancing device).  A journal or log book to write down your results. How to check your blood glucose Checking your blood glucose 1. Wash your hands for at least 20 seconds with soap and water. 2. Prick the side of your finger (not the tip) with the lancet. Do not use the same finger consecutively. 3. Gently rub the finger until a small drop of blood appears. 4. Follow instructions that come with your meter for inserting the test strip, applying blood to the strip, and using your blood glucose meter. 5. Write down your result and any notes in your log.   Using alternative sites Some meters allow you to use areas of your body other than your finger (alternative sites) to test your blood. The most common alternative sites are the forearm, the thigh, and the palm of your hand. Alternative sites may not be as accurate as the fingers because blood flow is slower in those areas. This means that the result you get may be delayed, and it may be different from the result that you would get from your finger. Use the finger only, and do not use alternative sites, if:  You think you have hypoglycemia.  You sometimes do not know that your blood glucose is getting low (hypoglycemia unawareness). General tips and recommendations Blood glucose log  Every time you check your blood glucose, write down your result. Also write down any notes about things that may be affecting your blood glucose, such as your  diet and exercise for the day. This information can help you and your health care provider: ? Look for patterns in your blood glucose over time. ? Adjust your diabetes management plan as needed.  Check if your meter allows you to download your records to a computer or if there is an app for the meter. Most glucose meters store a record of glucose readings in the meter.   If you have type 1 diabetes:  Check your blood glucose 4 or more times a day if  you are on intensive insulin therapy with multiple daily injections (MDI) or if you are using an insulin pump. Check your blood glucose: ? Before every meal and snack. ? Before bedtime.  Also check your blood glucose: ? If you have symptoms of hypoglycemia. ? After treating low blood glucose. ? Before doing activities that create a risk for injury, like driving or using machinery. ? Before and after exercise. ? Two hours after a meal. ? Occasionally between 2:00 a.m. and 3:00 a.m., as directed.  You may need to check your blood glucose more often, 6-10 times per day, if: ? You have diabetes that is not well controlled. ? You are ill. ? You have a history of severe hypoglycemia. ? You have hypoglycemia unawareness. If you have type 2 diabetes:  Check your blood glucose 2 or more times a day if you take insulin or other diabetes medicines.  Check your blood glucose 4 or more times a day if you are on intensive insulin therapy. Occasionally, you may also need to check your glucose between 2:00 a.m. and 3:00 a.m., as directed.  Also check your blood glucose: ? Before and after exercise. ? Before doing activities that create a risk for injury, like driving or using machinery.  You may need to check your blood glucose more often if: ? Your medicine is being adjusted. ? Your diabetes is not well controlled. ? You are ill. General tips  Make sure you always have your supplies with you.  After you use a few boxes of test strips, adjust (calibrate) your blood glucose meter by following instructions that came with your meter.  If you have questions or need help, all blood glucose meters have a 24-hour hotline phone number available that you can call. Also contact your health care provider with questions or concerns you may have. Where to find more information  The American Diabetes Association: www.diabetes.org  The Association of Diabetes Care & Education Specialists:  www.diabeteseducator.org Contact a health care provider if:  Your blood glucose is at or above 240 mg/dL (13.3 mmol/L) for 2 days in a row.  You have been sick or have had a fever for 2 days or longer, and you are not getting better.  You have any of the following problems for more than 6 hours: ? You cannot eat or drink. ? You have nausea or vomiting. ? You have diarrhea. Get help right away if:  Your blood glucose is lower than 54 mg/dL (3 mmol/L).  You become confused, or you have trouble thinking clearly.  You have difficulty breathing.  You have moderate or large ketone levels in your urine. These symptoms may represent a serious problem that is an emergency. Do not wait to see if the symptoms will go away. Get medical help right away. Call your local emergency services (911 in the [place] Do not drive yourself to the hospital. Summary  Monitoring your blood glucose is an important part of managing your  diabetes.  Blood glucose monitoring involves checking your blood glucose as often as directed and keeping a log or record of your results over time.  Your health care provider will set individualized treatment goals for you. Your goals will be based on your age, other medical conditions you have, and how you respond to diabetes treatment.  Every time you check your blood glucose, write down your result. Also, write down any notes about things that may be affecting your blood glucose, such as your diet and exercise for the day. This information is not intended to replace advice given to you by your health care provider. Make sure you discuss any questions you have with your health care provider. Document Revised: 08/15/2020 Document Reviewed: 08/15/2020 Elsevier Patient Education  2021 [place] Injection What is this medicine? DULAGLUTIDE (DOO la GLOO tide) controls blood sugar in people with type 2 diabetes. It is used with lifestyle changes like diet  and exercise. It may lower the risk of problems that need treatment in the hospital. These problems include heart attack or stroke. This medicine may be used for other purposes; ask your health care provider or pharmacist if you have questions. COMMON BRAND NAME(S): Trulicity What should I tell my health care provider before I take this medicine? They need to know if you have any of these conditions:  endocrine tumors (MEN 2) or if someone in your family had these tumors  eye disease, vision problems  history of pancreatitis  kidney disease  liver disease  stomach or intestine problems  thyroid cancer or if someone in your family had thyroid cancer  an unusual or allergic reaction to dulaglutide, other medicines, foods, dyes, or preservatives  pregnant or trying to get pregnant  breast-feeding How should I use this medicine? This medicine is injected under the skin. You will be taught how to prepare and give it. Take it as directed on the prescription label on the same day of each week. Do NOT prime the pen. Keep taking it unless your health care provider tells you to stop. If you use this medicine with insulin, you should inject this medicine and the insulin separately. Do not mix them together. Do not give the injections right next to each other. Change (rotate) injection sites with each injection. This drug comes with INSTRUCTIONS FOR USE. Ask your pharmacist for directions on how to use this medicine. Read the information carefully. Talk to your pharmacist or health care provider if you have questions. It is important that you put your used needles and syringes in a special sharps container. Do not put them in a trash can. If you do not have a sharps container, call your pharmacist or health care provider to get one. A special MedGuide will be given to you by the pharmacist with each prescription and refill. Be sure to read this information carefully each time. Talk to your health  care provider about the use of this medicine in children. Special care may be needed. Overdosage: If you think you have taken too much of this medicine contact a poison control center or emergency room at once. NOTE: This medicine is only for you. Do not share this medicine with others. What if I miss a dose? If you miss a dose, take it as soon as you can unless it is more than 3 days late. If it is more than 3 days late, skip the missed dose. Take the next dose at the normal  time. What may interact with this medicine?  other medicines for diabetes Many medications may cause changes in blood sugar, these include:  alcohol containing beverages  antiviral medicines for HIV or AIDS  aspirin and aspirin-like drugs  certain medicines for blood pressure, heart disease, irregular heart beat  chromium  diuretics  female hormones, such as estrogens or progestins, birth control pills  fenofibrate  gemfibrozil  isoniazid  lanreotide  female hormones or anabolic steroids  MAOIs like Carbex, Eldepryl, Marplan, Nardil, and Parnate  medicines for allergies, asthma, cold, or cough  medicines for depression, anxiety, or psychotic disturbances  medicines for weight loss  niacin  nicotine  NSAIDs, medicines for pain and inflammation, like ibuprofen or naproxen  octreotide  pasireotide  pentamidine  phenytoin  probenecid  quinolone antibiotics such as ciprofloxacin, levofloxacin, ofloxacin  some herbal dietary supplements  steroid medicines such as prednisone or cortisone  sulfamethoxazole; trimethoprim  thyroid hormones Some medications can hide the warning symptoms of low blood sugar (hypoglycemia). You may need to monitor your blood sugar more closely if you are taking one of these medications. These include:  beta-blockers, often used for high blood pressure or heart problems (examples include atenolol, metoprolol,  propranolol)  clonidine  guanethidine  reserpine This list may not describe all possible interactions. Give your health care provider a list of all the medicines, herbs, non-prescription drugs, or dietary supplements you use. Also tell them if you smoke, drink alcohol, or use illegal drugs. Some items may interact with your medicine. What should I watch for while using this medicine? Visit your health care provider for regular checks on your progress. Check with your health care provider if you have severe diarrhea, nausea, and vomiting, or if you sweat a lot. The loss of too much body fluid may make it dangerous for you to take this medicine. A test called the HbA1C (A1C) will be monitored. This is a simple blood test. It measures your blood sugar control over the last 2 to 3 months. You will receive this test every 3 to 6 months. Learn how to check your blood sugar. Learn the symptoms of low and high blood sugar and how to manage them. Always carry a quick-source of sugar with you in case you have symptoms of low blood sugar. Examples include hard sugar candy or glucose tablets. Make sure others know that you can choke if you eat or drink when you develop serious symptoms of low blood sugar, such as seizures or unconsciousness. Get medical help at once. Tell your health care provider if you have high blood sugar. You might need to change the dose of your medicine. If you are sick or exercising more than usual, you may need to change the dose of your medicine. Do not skip meals. Ask your health care provider if you should avoid alcohol. Many nonprescription cough and cold products contain sugar or alcohol. These can affect blood sugar. Pens should never be shared. Even if the needle is changed, sharing may result in passing of viruses like hepatitis or HIV. Wear a medical ID bracelet or chain. Carry a card that describes your condition. List the medicines and doses you take on the card. What side  effects may I notice from receiving this medicine? Side effects that you should report to your doctor or health care professional as soon as possible:  allergic reactions (skin rash, itching or hives; swelling of the face, lips, or tongue)  changes in vision  diarrhea that continues  or is severe  infection (fever, chills, cough, sore throat, pain or trouble passing urine)  kidney injury (trouble passing urine or change in the amount of urine)  low blood sugar (feeling anxious; confusion; dizziness; increased hunger; unusually weak or tired; increased sweating; shakiness; cold, clammy skin; irritable; headache; blurred vision; fast heartbeat; loss of consciousness)  lump or swelling on the neck  trouble breathing  trouble swallowing  unusual stomach upset or pain  vomiting Side effects that usually do not require medical attention (report to your doctor or health care professional if they continue or are bothersome):  lack or loss of appetite  nausea  pain, redness, or irritation at site where injected This list may not describe all possible side effects. Call your doctor for medical advice about side effects. You may report side effects to FDA at 1-800-FDA-1088. Where should I keep my medicine? Keep out of the reach of children and pets. Refrigeration (preferred): Store unopened pens in a refrigerator between 2 and 8 degrees C (36 and 46 degrees F). Keep it in the original carton until you are ready to take it. Do not freeze or use if the medicine has been frozen. Protect from light. Get rid of any unused medicine after the expiration date on the label. Room Temperature: The pen may be stored at room temperature below 30 degrees C (86 degrees F) for up to a total of 14 days if needed. Protect from light. Avoid exposure to extreme heat. If it is stored at room temperature, throw away any unused medicine after 14 days or after it expires, whichever is first. To get rid of medicines  that are no longer needed or have expired:  Take the medicine to a medicine take-back program. Check with your pharmacy or law enforcement to find a location.  If you cannot return the medicine, ask your pharmacist or health care provider how to get rid of this medicine safely. NOTE: This sheet is a summary. It may not cover all possible information. If you have questions about this medicine, talk to your doctor, pharmacist, or health care provider.  2021 Elsevier/Gold Standard (2020-09-17 07:35:51)

## 2021-02-15 ENCOUNTER — Telehealth: Payer: Self-pay | Admitting: Family Medicine

## 2021-02-15 NOTE — Telephone Encounter (Signed)
Routed to PCP. Jasmin Collins, CMA  

## 2021-02-15 NOTE — Telephone Encounter (Signed)
Patient was seen yesterday stopped in today she states medicaid denied glucose meter.  That Doctor Lilland will have to contact medicaid. Patient's [phone]

## 2021-02-18 NOTE — Telephone Encounter (Signed)
It will be covered! I will call pharmacy, I just have to provide them with coupon. I will let you know when it is all set!

## 2021-02-19 ENCOUNTER — Ambulatory Visit: Payer: Medicaid Other

## 2021-02-21 NOTE — Telephone Encounter (Signed)
Patient picked up glucometer on 02/15/21.

## 2021-02-26 ENCOUNTER — Ambulatory Visit: Payer: Medicaid Other

## 2021-02-27 ENCOUNTER — Telehealth: Payer: Self-pay | Admitting: Family Medicine

## 2021-02-27 NOTE — Telephone Encounter (Signed)
Returned patient's call from yesterday and informed her I would put in the request for the referral to pain medicine with [facility] to Dr. Oleh Genin.  Patient is wanting to see them for her back pain.  Jasmin Collins,CMA

## 2021-02-27 NOTE — Telephone Encounter (Signed)
Patient stopped needs referral [facility] needs the referral for pain meds for her back.  Any questions call patient [phone]

## 2021-03-01 ENCOUNTER — Other Ambulatory Visit: Payer: Self-pay | Admitting: Family Medicine

## 2021-03-01 DIAGNOSIS — M545 Low back pain, unspecified: Secondary | ICD-10-CM

## 2021-03-01 NOTE — Telephone Encounter (Signed)
Referral placed for Jasmin Collins per patient request.  Rise Patience, DO

## 2021-03-01 NOTE — Progress Notes (Signed)
Patient request for pain management referral to [facility] (per patietn preference) placed today.   Jasmin Paule, DO

## 2021-03-01 NOTE — Telephone Encounter (Signed)
Referral faxed and they will contact patient for an appt.  Kimsey Demaree,CMA

## 2021-03-05 ENCOUNTER — Ambulatory Visit: Payer: Medicaid Other

## 2021-03-12 ENCOUNTER — Encounter: Payer: Medicaid Other | Attending: Family Medicine

## 2021-03-14 ENCOUNTER — Other Ambulatory Visit: Payer: Self-pay | Admitting: Family Medicine

## 2021-03-14 DIAGNOSIS — E119 Type 2 diabetes mellitus without complications: Secondary | ICD-10-CM

## 2021-03-22 ENCOUNTER — Encounter: Payer: Self-pay | Admitting: Family Medicine

## 2021-03-22 ENCOUNTER — Other Ambulatory Visit: Payer: Self-pay

## 2021-03-22 ENCOUNTER — Ambulatory Visit (INDEPENDENT_AMBULATORY_CARE_PROVIDER_SITE_OTHER): Payer: Medicaid Other | Admitting: Family Medicine

## 2021-03-22 VITALS — BP 110/62 | HR 97 | Ht 63.0 in | Wt 229.4 lb

## 2021-03-22 DIAGNOSIS — G479 Sleep disorder, unspecified: Secondary | ICD-10-CM

## 2021-03-22 DIAGNOSIS — E119 Type 2 diabetes mellitus without complications: Secondary | ICD-10-CM | POA: Diagnosis not present

## 2021-03-22 DIAGNOSIS — D509 Iron deficiency anemia, unspecified: Secondary | ICD-10-CM

## 2021-03-22 NOTE — Patient Instructions (Addendum)
It was great seeing you today!  Today we discussed the following:  Type 2 diabetes: You seem to be doing really well with the current medications, we will have you back to check your A1c at the end of May.  Insomnia and anxiety: Continue taking the hydroxyzine at night and citalopram as long as you are able to tolerate. I am giving you information about sleep hygiene and will call you in 2 weeks to check in and see if there is any improvement, if not we will discuss medication changes at that time.   Also, your previous lab work showed anemia and you were also found to have a little bit of a murmur, which is likely benign and we will just continue to monitor if it gets better as we treat your anemia. We will get labs today, but we will also need to consider getting a colonoscopy in the future to make sure that there is no risk of colon cancer.   Your pain management doctor will be over all of your pain medications, any questions or issues with is willing to be addressed through them.      Quality Sleep Information, Adult Quality sleep is important for your mental and physical health. It also improves your quality of life. Quality sleep means you:  Are asleep for most of the time you are in bed.  Fall asleep within 30 minutes.  Wake up no more than once a night.  Are awake for no longer than 20 minutes if you do wake up during the night. Most adults need 7-8 hours of quality sleep each night. How can poor sleep affect me? If you do not get enough quality sleep, you may have:  Mood swings.  Daytime sleepiness.  Confusion.  Decreased reaction time.  Sleep disorders, such as insomnia and sleep apnea.  Difficulty with: ? Solving problems. ? Coping with stress. ? Paying attention. These issues may affect your performance and productivity at work, school, and at home. Lack of sleep may also put you at higher risk for accidents, suicide, and risky behaviors. If you do not get  quality sleep you may also be at higher risk for several health problems, including:  Infections.  Type 2 diabetes.  Heart disease.  High blood pressure.  Obesity.  Worsening of long-term conditions, like arthritis, kidney disease, depression, Parkinson's disease, and epilepsy. What actions can I take to get more quality sleep?  Stick to a sleep schedule. Go to sleep and wake up at about the same time each day. Do not try to sleep less on weekdays and make up for lost sleep on weekends. This does not work.  Try to get about 30 minutes of exercise on most days. Do not exercise 2-3 hours before going to bed.  Limit naps during the day to 30 minutes or less.  Do not use any products that contain nicotine or tobacco, such as cigarettes or e-cigarettes. If you need help quitting, ask your health care provider.  Do not drink caffeinated beverages for at least 8 hours before going to bed. Coffee, tea, and some sodas contain caffeine.  Do not drink alcohol close to bedtime.  Do not eat large meals close to bedtime.  Do not take naps in the late afternoon.  Try to get at least 30 minutes of sunlight every day. Morning sunlight is best.  Make time to relax before bed. Reading, listening to music, or taking a hot bath promotes quality sleep.  Make your  bedroom a place that promotes quality sleep. Keep your bedroom dark, quiet, and at a comfortable room temperature. Make sure your bed is comfortable. Take out sleep distractions like TV, a computer, smartphone, and bright lights.  If you are lying awake in bed for longer than 20 minutes, get up and do a relaxing activity until you feel sleepy.  Work with your health care provider to treat medical conditions that may affect sleeping, such as: ? Nasal obstruction. ? Snoring. ? Sleep apnea and other sleep disorders.  Talk to your health care provider if you think any of your prescription medicines may cause you to have difficulty falling  or staying asleep.  If you have sleep problems, talk with a sleep consultant. If you think you have a sleep disorder, talk with your health care provider about getting evaluated by a specialist.      Where to find more information  [place] website: https://sleepfoundation.org  National Heart, Lung, and [place] ([place]): http://www.saunders.info/.pdf  Centers for Disease Control and Prevention (CDC): LearningDermatology.pl Contact a health care provider if you:  Have trouble getting to sleep or staying asleep.  Often wake up very early in the morning and cannot get back to sleep.  Have daytime sleepiness.  Have daytime sleep attacks of suddenly falling asleep and sudden muscle weakness (narcolepsy).  Have a tingling sensation in your legs with a strong urge to move your legs (restless legs syndrome).  Stop breathing briefly during sleep (sleep apnea).  Think you have a sleep disorder or are taking a medicine that is affecting your quality of sleep. Summary  Most adults need 7-8 hours of quality sleep each night.  Getting enough quality sleep is an important part of health and well-being.  Make your bedroom a place that promotes quality sleep and avoid things that may cause you to have poor sleep, such as alcohol, caffeine, smoking, and large meals.  Talk to your health care provider if you have trouble falling asleep or staying asleep. This information is not intended to replace advice given to you by your health care provider. Make sure you discuss any questions you have with your health care provider. Document Revised: 02/24/2018 Document Reviewed: 02/24/2018 Elsevier Patient Education  2021 [employer].

## 2021-03-22 NOTE — Progress Notes (Signed)
    SUBJECTIVE:   CHIEF COMPLAINT / HPI:   T2DM Patient prescribed Trulicity at last visit and was administered sample in the office.  Reports no side effects and tolerating medication well. Patient has lost 16lbs and is noticing a change in her pant sizes and her family members have mentioned that she has lost weight.   Insomnia  Anxiety Patient says hydroxyzine is helping somewhat but the insomnia is still not much improved.  She reports that she is waking up at 2:58 AM and staying up to 5 or 6 AM.  Does report that she has some poor sleep hygiene and watches TV before bed and sometimes remembers to turn it off beforehand.  Iron deficiency anemia (with likely associated flow murmur) Patient reports that she is until in the past that she has had iron deficiency anemia with pica and that she has needed iron previously.  She is not taking her in quite awhile.  Reports no anemia symptoms currently and no excessive bleeding.   PERTINENT  PMH / PSH: GERD, T2DM, acute low back pain  OBJECTIVE:   BP 110/62   Pulse 97   Ht 5\' 3"  (1.6 m)   Wt 229 lb 6 oz (104 kg)   SpO2 98%   BMI 40.63 kg/m   Gen: well-appearing, NAD CV: RRR, no peripheral edema, 2/6 early systolic murmur loudest at LUSB louder with sitting than laying Pulm: CTAB, no wheezes/crackles GI: soft, non-tender, non-distended  ASSESSMENT/PLAN:   T2DM Patient reports medication compliance, currently on metformin 1000 mg twice daily, Trulicity 6.20 weekly.  BMP was completed at [facility] with creatinine 0.6, K4.1.  No evidence of kidney disease and no need for renal adjustment for medications. -Continue metformin and Trulicity -Follow-up at the end of May (about 4 weeks) for A1c  Insomnia  anxiety Patient currently on 25 mg hydroxyzine, 40 mg citalopram. -Patient counseled on sleep hygiene and given handout -We will call patient in 2 weeks with evaluation, if not improving will consider medication change at  that time  Systolic murmur, likely flow murmur 2/2 presumed IDA Patient recently had labs done at [facility] and was noted to have a hemoglobin of 8.3, MCV 71, platelet 481 which would be consistent with iron deficiency anemia with reactive thrombocytosis.  Patient reports that she has had IDA in the past and has not taken any iron supplementation recently.  Patient is at the [facility]ria but it was also recommended to get a colonoscopy to rule out colon cancer. -Obtaining iron, TIBC, ferritin -If deficient, will recommend supplementation with iron -We will readdress colonoscopy at next visit  Rise Patience, [place]

## 2021-03-23 ENCOUNTER — Telehealth: Payer: Self-pay | Admitting: Family Medicine

## 2021-03-23 DIAGNOSIS — D509 Iron deficiency anemia, unspecified: Secondary | ICD-10-CM

## 2021-03-23 LAB — IRON,TIBC AND FERRITIN PANEL
Ferritin: 6 ng/mL — ABNORMAL LOW (ref 15–150)
Iron Saturation: 4 % — CL (ref 15–55)
Iron: 15 ug/dL — ABNORMAL LOW (ref 27–159)
Total Iron Binding Capacity: 422 ug/dL (ref 250–450)
UIBC: 407 ug/dL (ref 131–425)

## 2021-03-23 MED ORDER — FERROUS SULFATE 325 (65 FE) MG PO TABS: 325.0000 mg | ORAL_TABLET | Freq: Every day | ORAL | 3 refills | Status: DC

## 2021-03-23 NOTE — Telephone Encounter (Signed)
Called patient with results, patient with history of IDA, current with low iron of 15 and critically low iron saturation of 4. Patient to restart iron supplementation, sending in prescription now. Discussed with patient that I will schedule her for a lab visit to get a CBC to ensure that hemoglobin level is appropriate. Patient reports no concerns currently with shortness of breath or fatigue. Consideration for testing for a hemoglobinopathy if deemed appropriate.   Rena Sweeden, DO  03/23/2021 9:51 AM

## 2021-03-25 ENCOUNTER — Ambulatory Visit: Payer: Medicaid Other | Admitting: Dietician

## 2021-03-25 ENCOUNTER — Other Ambulatory Visit: Payer: Self-pay | Admitting: Family Medicine

## 2021-03-25 DIAGNOSIS — D509 Iron deficiency anemia, unspecified: Secondary | ICD-10-CM

## 2021-03-26 ENCOUNTER — Other Ambulatory Visit: Payer: Medicaid Other

## 2021-03-26 ENCOUNTER — Other Ambulatory Visit: Payer: Self-pay

## 2021-03-26 DIAGNOSIS — D509 Iron deficiency anemia, unspecified: Secondary | ICD-10-CM

## 2021-03-27 ENCOUNTER — Other Ambulatory Visit: Payer: Medicaid Other

## 2021-03-27 LAB — CBC
Hematocrit: 29.8 % — ABNORMAL LOW (ref 34.0–46.6)
Hemoglobin: 8.6 g/dL — ABNORMAL LOW (ref 11.1–15.9)
MCH: 19.9 pg — ABNORMAL LOW (ref 26.6–33.0)
MCHC: 28.9 g/dL — ABNORMAL LOW (ref 31.5–35.7)
MCV: 69 fL — ABNORMAL LOW (ref 79–97)
Platelets: 414 10*3/uL (ref 150–450)
RBC: 4.32 x10E6/uL (ref 3.77–5.28)
RDW: 22.3 % — ABNORMAL HIGH (ref 11.7–15.4)
WBC: 6.8 10*3/uL (ref 3.4–10.8)

## 2021-04-01 ENCOUNTER — Ambulatory Visit: Payer: Medicaid Other | Attending: Family Medicine | Admitting: Physical Therapy

## 2021-04-07 ENCOUNTER — Ambulatory Visit
Admission: EM | Admit: 2021-04-07 | Discharge: 2021-04-07 | Disposition: A | Discharge status: Home / Self Care | Payer: Medicaid Other | Attending: Emergency Medicine | Admitting: Emergency Medicine

## 2021-04-07 ENCOUNTER — Encounter: Payer: Self-pay | Admitting: *Deleted

## 2021-04-07 ENCOUNTER — Other Ambulatory Visit: Payer: Self-pay

## 2021-04-07 DIAGNOSIS — H6121 Impacted cerumen, right ear: Secondary | ICD-10-CM | POA: Diagnosis not present

## 2021-04-07 HISTORY — DX: Gastro-esophageal reflux disease without esophagitis: K21.9

## 2021-04-07 HISTORY — DX: Type 2 diabetes mellitus without complications: E11.9

## 2021-04-07 NOTE — Discharge Instructions (Signed)
Use Debrox to help prevent reaccumulation of wax.

## 2021-04-07 NOTE — ED Triage Notes (Signed)
C/O right ear "whistling" and "echoing" over past couple days, but unable to "pop" right ear.  Denies pain or fevers.

## 2021-04-07 NOTE — ED Provider Notes (Signed)
HPI  SUBJECTIVE:  Jasmin Collins is a 55 y.o. female who presents with echoing, whistling in her right ear starting a week ago.  She states that she feels as if her ear won't "pop".  She denies pain, trauma, foreign body insertion, exposure to loud noise, decreased hearing, otorrhea, vertigo, recent URI or allergy symptoms.  She tried raising her arms and self insufflation without improvement in her symptoms.  No aggravating factors.  She has a past medical history of diabetes.  HFW:YOVZCHY, Alana, DO   Past Medical History:  Diagnosis Date  . Anxiety   . Diabetes mellitus without complication ([place])   . GERD (gastroesophageal reflux disease)     History reviewed. No pertinent surgical history.  Family History  Problem Relation Age of Onset  . Hypertension Mother     Social History   Tobacco Use  . Smoking status: Never Smoker  . Smokeless tobacco: Never Used  Vaping Use  . Vaping Use: Never used  Substance Use Topics  . Alcohol use: No  . Drug use: No    No current facility-administered medications for this encounter.  Current Outpatient Medications:  .  atorvastatin (LIPITOR) 20 MG tablet, Take 1 tablet (20 mg total) by mouth daily., Disp: 30 tablet, Rfl: 3 .  citalopram (CELEXA) 40 MG tablet, Take 1 tablet (40 mg total) by mouth daily., Disp: 30 tablet, Rfl: 3 .  Dulaglutide (TRULICITY) 8.50 YD/7.4JO SOPN, Inject 0.75 mg into the skin once a week. Inject once every Thursday., Disp: 0.5 mL, Rfl: 3 .  ferrous sulfate 325 (65 FE) MG tablet, Take 1 tablet (325 mg total) by mouth daily with breakfast. If having stomach/GI issues, can take every other day., Disp: 30 tablet, Rfl: 3 .  hydrOXYzine (ATARAX/VISTARIL) 25 MG tablet, Take 1 tablet (25 mg total) by mouth 3 (three) times daily as needed., Disp: 30 tablet, Rfl: 0 .  metFORMIN (GLUCOPHAGE-XR) 500 MG 24 hr tablet, TAKE 1 TABLET(500 MG) BY MOUTH DAILY WITH SUPPER, Disp: 60 tablet, Rfl: 3 .  naproxen (NAPROSYN) 500 MG  tablet, Take 500 mg by mouth 2 (two) times daily., Disp: , Rfl:  .  pantoprazole (PROTONIX) 40 MG tablet, TAKE 1 TABLET(40 MG) BY MOUTH DAILY, Disp: 30 tablet, Rfl: 3 .  Accu-Chek Softclix Lancets lancets, Use as instructed, Disp: 100 each, Rfl: 12 .  Blood Glucose Monitoring Suppl (ACCU-CHEK GUIDE) w/Device KIT, 1 Device by Does not apply route daily., Disp: 1 kit, Rfl: 0 .  glucose blood (ACCU-CHEK GUIDE) test strip, Use as instructed, Disp: 100 each, Rfl: 12  No Known Allergies   ROS  As noted in HPI.   Physical Exam  BP 114/78 (BP Location: Right Arm)   Pulse 61   Temp 98.3 F (36.8 C) (Oral)   Resp 20   LMP  (LMP Unknown)   SpO2 97%   Constitutional: Well developed, well nourished, no acute distress Eyes:  EOMI, conjunctiva normal bilaterally HENT: Normocephalic, atraumatic,mucus membranes moist.  Right TM completely obscured with cerumen deep in the EAC.  Hearing altered compared to left.  Left TM normal Respiratory: Normal inspiratory effort Cardiovascular: Normal rate GI: nondistended skin: No rash, skin intact Musculoskeletal: no deformities Neurologic: Alert & oriented x 3, no focal neuro deficits Psychiatric: Speech and behavior appropriate   ED Course   Medications - No data to display  Orders Placed This Encounter  Procedures  . Ear wax removal    Right ear only    Standing Status:  Standing    Number of Occurrences:   1    No results found for this or any previous visit (from the past 24 hour(s)). No results found.  ED Clinical Impression  1. Impacted cerumen of right ear      ED Assessment/Plan  Patient with cerumen deep in her ear.  It is unreachable by curette.  We will have this irrigated and reevaluate.  I suspect this is the cause of her symptoms.  On reevaluation, patient states that her symptoms have resolved.  Right EAC TM normal.  No perforation.  Symptoms coming from cerumen impaction.,  Resolved with cerumen removal . Debrox to  prevent wax buildup.  Follow-up with PMD as needed.   No orders of the defined types were placed in this encounter.     *This clinic note was created using Dragon dictation software. Therefore, there may be occasional mistakes despite careful proofreading.  ?    Melynda Ripple, MD 04/08/21 [phone]

## 2021-04-10 ENCOUNTER — Telehealth: Payer: Self-pay | Admitting: Family Medicine

## 2021-04-10 NOTE — Telephone Encounter (Signed)
Called patient regarding recent sleep issues, patient was previously given a handout and education on sleep hygiene. Patient reports that she has started turning off the TV right as she goes to sleep and has not made any other changes. She is still taking the hydroxyzine with some improvement. She reports she is still waking up from 2am-4am, will fall back asleep and then wake up again at 8am. After she drops her grandson off at school she may take another 1-2 hour nap. Patient was advised to turn the TV off 1-2 hours before bedtime, lay in a cool and dark area (without use of electronics) for 30 minutes prior to going to sleep. She was also advised to stop the nap during the morning and make sure to keep active during the day. Patient to implement changes and return to care in the next 2 weeks if there is no improvement in sleep quality for further assessment.   Fredric Slabach, DO

## 2021-04-15 ENCOUNTER — Ambulatory Visit: Payer: Medicaid Other | Admitting: Dietician

## 2021-05-30 ENCOUNTER — Encounter: Payer: Self-pay | Admitting: Family Medicine

## 2021-05-30 ENCOUNTER — Other Ambulatory Visit: Payer: Self-pay

## 2021-05-30 ENCOUNTER — Ambulatory Visit: Payer: Medicaid Other | Admitting: Family Medicine

## 2021-05-30 VITALS — BP 110/70 | HR 72 | Ht 63.0 in | Wt 217.2 lb

## 2021-05-30 DIAGNOSIS — E785 Hyperlipidemia, unspecified: Secondary | ICD-10-CM

## 2021-05-30 DIAGNOSIS — D649 Anemia, unspecified: Secondary | ICD-10-CM | POA: Insufficient documentation

## 2021-05-30 DIAGNOSIS — Z0000 Encounter for general adult medical examination without abnormal findings: Secondary | ICD-10-CM | POA: Diagnosis not present

## 2021-05-30 DIAGNOSIS — F419 Anxiety disorder, unspecified: Secondary | ICD-10-CM | POA: Diagnosis not present

## 2021-05-30 DIAGNOSIS — Z6838 Body mass index (BMI) 38.0-38.9, adult: Secondary | ICD-10-CM | POA: Diagnosis not present

## 2021-05-30 DIAGNOSIS — Z1211 Encounter for screening for malignant neoplasm of colon: Secondary | ICD-10-CM | POA: Diagnosis not present

## 2021-05-30 DIAGNOSIS — G479 Sleep disorder, unspecified: Secondary | ICD-10-CM | POA: Diagnosis not present

## 2021-05-30 DIAGNOSIS — E1169 Type 2 diabetes mellitus with other specified complication: Secondary | ICD-10-CM | POA: Diagnosis not present

## 2021-05-30 DIAGNOSIS — E119 Type 2 diabetes mellitus without complications: Secondary | ICD-10-CM

## 2021-05-30 LAB — POCT GLYCOSYLATED HEMOGLOBIN (HGB A1C): HbA1c, POC (controlled diabetic range): 5.1 % (ref 0.0–7.0)

## 2021-05-30 MED ORDER — CITALOPRAM HYDROBROMIDE 40 MG PO TABS: 40.0000 mg | ORAL_TABLET | Freq: Every day | ORAL | 3 refills | Status: DC

## 2021-05-30 MED ORDER — HYDROXYZINE HCL 25 MG PO TABS: 25.0000 mg | ORAL_TABLET | Freq: Three times a day (TID) | ORAL | 0 refills | Status: DC | PRN

## 2021-05-30 MED ORDER — ATORVASTATIN CALCIUM 20 MG PO TABS: 20.0000 mg | ORAL_TABLET | Freq: Every day | ORAL | 3 refills | Status: DC

## 2021-05-30 NOTE — Assessment & Plan Note (Signed)
Referral placed to GI for colonoscopy.

## 2021-05-30 NOTE — Assessment & Plan Note (Signed)
Patient reports significant improvement and is now sleeping through the night.

## 2021-05-30 NOTE — Progress Notes (Signed)
    SUBJECTIVE:   CHIEF COMPLAINT / HPI:   Anxiety  panic attack Monday (6/27) had a panic attack that was unrelated to any stressors and occurred in the middle of the night.  She ended up calling her friend who called EMS to come check on her and her blood pressure was elevated with a systolic of around 673.  She states that she has not actually had the EMS have to visit since last year, which is significantly improved from prior years.  She did note that he took 3 doses of citalopram at that time.  She does feel like she would be okay right now without therapy but wants to also have a therapy resource just in case she wants to talk with someone.   T2DM  Reports that her diabetes is much improved on Trulicity and with her diet, she has lost a significant amount of weight and is doing really well.  She is able to have a lot more movement and even touch her toes and go up and down the stairs doing really well.  Recent insomnia She also notes that she is sleeping well and is no longer having issues with her insomnia at this time.   PERTINENT  PMH / PSH: T2DM, hx of anemia, anxiety  OBJECTIVE:   BP 110/70   Pulse 72   Ht 5\' 3"  (1.6 m)   Wt 217 lb 4 oz (98.5 kg)   LMP  (LMP Unknown)   SpO2 98%   BMI 38.48 kg/m   General: NAD, well-appearing, well-nourished Respiratory: No respiratory distress, breathing comfortably, able to speak in full sentences Skin: warm and dry, no rashes noted on exposed skin Psych: Appropriate affect and mood   ASSESSMENT/PLAN:   Type 2 diabetes mellitus without complication, without long-term current use of insulin (HCC) HbA1c 12.9>5.1, currently on Trulicity and metformin with diet changes. Discussed risk of some neuropathy with severe decreases in A1c, patient is agreeable to pausing the Trulicity and checking her blood sugars with close follow-up in the next month. - Pause Trulicity - Fasting CBG record for the next month - F/u 1 month to monitor for  symptoms and weight  - Checking BMP today to monitor kidney function - Refilled atorvastatin  Difficulty sleeping Patient reports significant improvement and is now sleeping through the night.  Anxiety Patient had significant anxiety in the last week with a panic attack on 05/27/21 in which she called EMS.  She took 3 citalopram at that time.  Counseled patient on appropriate dosing of citalopram at 1 pill/day every day.  Will trial as needed hydroxyzine for days with increasing anxiety to try and stave off any further panic attacks.  Reassured the patient has not had an attack requiring EMS support since last year. -Hydroxyzine 25 mg 3 times daily as needed for anxiety -Continue citalopram 40 mg once daily -Gave patient therapy/counseling resources  Body mass index (BMI) 38.0-38.9, adult Current weight of 217 pounds, patient previously 245 in March of this year.  Patient has done well with significant diet changes and Trulicity.  We will put Trulicity on pause, encourage continuation of diet changes  Healthcare maintenance Referral placed to GI for colonoscopy  Anemia Patient previously started on iron supplementation.  Currently asymptomatic and has been having an increase in energy since having weight loss. - Check CBC and ferritin today - Continue oral iron supplementation     Rise Patience, DO [place]

## 2021-05-30 NOTE — Assessment & Plan Note (Signed)
Patient previously started on iron supplementation.  Currently asymptomatic and has been having an increase in energy since having weight loss. - Check CBC and ferritin today - Continue oral iron supplementation

## 2021-05-30 NOTE — Assessment & Plan Note (Signed)
Current weight of 217 pounds, patient previously 245 in March of this year.  Patient has done well with significant diet changes and Trulicity.  We will put Trulicity on pause, encourage continuation of diet changes

## 2021-05-30 NOTE — Patient Instructions (Signed)
A1c today was great at 5.1, which is much improved from the previous.  At this time we will pause the Trulicity to make sure that it did not drop too quickly and we will follow-up in the next month and see where we are at some make sure to keep an eye on your weight and how you are feeling.  I am prescribing hydroxyzine (Atarax/Vistaril) for your recent anxiety attack.  When you start feeling like you are having these sorts of symptoms then I would recommend taking 1 of these as you need but not every single day.  For your citalopram remember to only take once 1 time a day as that is the most effective treatment.  We have sent a referral for colonoscopy and are getting labs today, if anything is abnormal we will call you with the results otherwise it may be a call or will just be a letter in the mail.        Therapy and Counseling Resources Most providers on this list will take Medicaid. Patients with commercial insurance or Medicare should contact their insurance company to get a list of in network providers.  [facility]:Psychiatry and Counseling [address] [facility] [address]. [place], [place] [address] [place], [place], [place] [address]      [place] [address]  [place], [place] [address] [phone]  [place] [address] [place]  [place], [place] [address]       [phone]     [facility] (virtual only) [phone]  Jinny Blossom [facility] [address], [place], [place]  Family [facility]:  [place] [phone]  [facility] Counseling:  [place] [address] [phone]  [facility] (under & uninsured) [address] [place] [phone]    [email or web address]    [place] [address]  [place]     [phone]  [facility] [place]     Phone:  [phone]     [place]  [place] [address]      [place], [place] ext [place]: [place], [place], [place]   [place] ([place] therapist) [email or web address]  [address]   [place] [address]    [phone]    [facility]   [address],  [place], [place]   [place]  [place]  [facility]  [address] [place], [place]        [phone]  Open Access/Walk In Clinic under & uninsured  [facility]  [address] [place], [place] Crisis [phone]  [facility] [place],  ([place])   [place], [place]: [phone]) 8:30 - 12; 1 - 2:30  [facility] of the [place],  [place], [place]    [phone]):8:30 - 12; 2 - 3PM  [facility] [employer],  [address],  [place]; [phone]):   Mon - Fri 8 AM - 5 PM  [facility] [place]   MWF 12:30 to 3:00  or call to schedule an appointment  [phone]  Specific Provider options Psychology Today  [email or web address] click on find a therapist  enter your zip code left side and select or tailor a therapist for your specific need.   [facility] Provider Directory [email or web address]  (Medicaid)   Follow all drop down to find a provider  [place] or [email or web address] [address], [place], [place] Recovery support and educational   24- Hour Availability:   [facility]  [address] [place], [place] Crisis [phone]  [facility] the [employer] [phone]  [place]  [phone]   [place]  [phone] (after hours)  Therapeutic Alternative/Mobile Crisis   [phone]  [place] National Suicide Hotline  [phone] Diamantina Monks)  Call 911 or go to emergency room  [facility]  [phone]);  [place] and [employer]  [phone]); [place], [place], [place], [place], [place], [place], [place]

## 2021-05-30 NOTE — Assessment & Plan Note (Signed)
Patient had significant anxiety in the last week with a panic attack on 05/27/21 in which she called EMS.  She took 3 citalopram at that time.  Counseled patient on appropriate dosing of citalopram at 1 pill/day every day.  Will trial as needed hydroxyzine for days with increasing anxiety to try and stave off any further panic attacks.  Reassured the patient has not had an attack requiring EMS support since last year. -Hydroxyzine 25 mg 3 times daily as needed for anxiety -Continue citalopram 40 mg once daily -Gave patient therapy/counseling resources

## 2021-05-30 NOTE — Assessment & Plan Note (Addendum)
HbA1c 12.9>5.1, currently on Trulicity and metformin with diet changes. Discussed risk of some neuropathy with severe decreases in A1c, patient is agreeable to pausing the Trulicity and checking her blood sugars with close follow-up in the next month. - Pause Trulicity - Fasting CBG record for the next month - F/u 1 month to monitor for symptoms and weight  - Checking BMP today to monitor kidney function - Refilled atorvastatin

## 2021-05-31 ENCOUNTER — Telehealth: Payer: Self-pay | Admitting: Family Medicine

## 2021-05-31 LAB — CBC
Hematocrit: 36.1 % (ref 34.0–46.6)
Hemoglobin: 11.6 g/dL (ref 11.1–15.9)
MCH: 26.3 pg — ABNORMAL LOW (ref 26.6–33.0)
MCHC: 32.1 g/dL (ref 31.5–35.7)
MCV: 82 fL (ref 79–97)
Platelets: 263 10*3/uL (ref 150–450)
RBC: 4.41 x10E6/uL (ref 3.77–5.28)
RDW: 24.3 % — ABNORMAL HIGH (ref 11.7–15.4)
WBC: 6.1 10*3/uL (ref 3.4–10.8)

## 2021-05-31 LAB — BASIC METABOLIC PANEL
BUN/Creatinine Ratio: 22 (ref 9–23)
BUN: 15 mg/dL (ref 6–24)
CO2: 27 mmol/L (ref 20–29)
Calcium: 9.7 mg/dL (ref 8.7–10.2)
Chloride: 105 mmol/L (ref 96–106)
Creatinine, Ser: 0.68 mg/dL (ref 0.57–1.00)
Glucose: 88 mg/dL (ref 65–99)
Potassium: 4.9 mmol/L (ref 3.5–5.2)
Sodium: 144 mmol/L (ref 134–144)
eGFR: 104 mL/min/{1.73_m2} (ref >59)

## 2021-05-31 LAB — FERRITIN: Ferritin: 22 ng/mL (ref 15–150)

## 2021-05-31 NOTE — Telephone Encounter (Signed)
Called patient with lab results, ferritin remains low and patient to continue taking iron supplementation. Will repeat labs in the next few months.  Jasmin Pontillo, DO

## 2021-07-02 ENCOUNTER — Encounter (HOSPITAL_COMMUNITY): Payer: Self-pay

## 2021-07-02 ENCOUNTER — Other Ambulatory Visit: Payer: Self-pay

## 2021-07-02 ENCOUNTER — Ambulatory Visit
Admission: EM | Admit: 2021-07-02 | Discharge: 2021-07-02 | Disposition: A | Discharge status: Home / Self Care | Payer: Medicaid Other | Attending: Urgent Care | Admitting: Urgent Care

## 2021-07-02 ENCOUNTER — Emergency Department (HOSPITAL_COMMUNITY)
Admission: EM | Admit: 2021-07-02 | Discharge: 2021-07-02 | Disposition: A | Discharge status: Home / Self Care | Payer: Medicaid Other | Attending: Emergency Medicine | Admitting: Emergency Medicine

## 2021-07-02 DIAGNOSIS — Z113 Encounter for screening for infections with a predominantly sexual mode of transmission: Secondary | ICD-10-CM | POA: Insufficient documentation

## 2021-07-02 DIAGNOSIS — Z7251 High risk heterosexual behavior: Secondary | ICD-10-CM | POA: Insufficient documentation

## 2021-07-02 DIAGNOSIS — Z794 Long term (current) use of insulin: Secondary | ICD-10-CM | POA: Diagnosis not present

## 2021-07-02 DIAGNOSIS — E119 Type 2 diabetes mellitus without complications: Secondary | ICD-10-CM | POA: Insufficient documentation

## 2021-07-02 DIAGNOSIS — Z7984 Long term (current) use of oral hypoglycemic drugs: Secondary | ICD-10-CM | POA: Diagnosis not present

## 2021-07-02 DIAGNOSIS — Z202 Contact with and (suspected) exposure to infections with a predominantly sexual mode of transmission: Secondary | ICD-10-CM | POA: Diagnosis present

## 2021-07-02 LAB — WET PREP, GENITAL
Sperm: NONE SEEN
Trich, Wet Prep: NONE SEEN
WBC, Wet Prep HPF POC: NONE SEEN
Yeast Wet Prep HPF POC: NONE SEEN

## 2021-07-02 NOTE — ED Triage Notes (Signed)
Pt states that her partner has complaints of discharge from his penis. Pt would like to have an STD check. Pt has no symptoms.

## 2021-07-02 NOTE — Discharge Instructions (Addendum)
You were seen in the ER today for an STD check- we will call you if any results are positive, do not have intercourse of any kind in the interim.   If positive you will need to inform all sexual partners.   Please use the health department or primary care for further STD screening needs.   Return to the ER for new or worsening symptoms or any other concerns.

## 2021-07-02 NOTE — ED Notes (Signed)
Pt perform self-collection of all samples needed for pelvic exam including GC/Chlamydia, Wet Prep, and In Tray GC. Samples received from pt, labeled and sent to lab. RN advised. [employer]

## 2021-07-02 NOTE — ED Provider Notes (Signed)
[facility] DEPT Provider Note   CSN: [id number] Arrival date & time: 07/02/21  2150     History Chief Complaint  Patient presents with   Exposure to STD    Jasmin Collins is a 55 y.o. female with a hx of DM, GERD, & anxiety who presents to the ED due to concern for possible STD exposure. Patient states her significant other whom she has been sexually active with started to have penile discharge a couple of days ago- she is concerned that this could be an STD and wanted to get checked out. She is asymptomatic- no alleviating/aggravating factors. Denies fever, N/V, abdominal pain, vaginal bleeding/discharge, or dysuria. Her sexual partner is getting checked out at an alternative hospital.   HPI     Past Medical History:  Diagnosis Date   Anxiety    Diabetes mellitus without complication ([place])    GERD (gastroesophageal reflux disease)     Patient Active Problem List   Diagnosis Date Noted   Healthcare maintenance 05/30/2021   Anemia 05/30/2021   Type 2 diabetes mellitus without complication, without long-term current use of insulin ([place]) 02/14/2021   Anxiety 02/14/2021   Acute bilateral low back pain without sciatica 10/05/2020   Body mass index (BMI) 38.0-38.9, adult 10/05/2020   Gastroesophageal reflux disease without esophagitis 10/05/2020   Difficulty sleeping 10/05/2020   Elevated glucose 10/05/2020    History reviewed. No pertinent surgical history.   OB History   No obstetric history on file.     Family History  Problem Relation Age of Onset   Hypertension Mother     Social History   Tobacco Use   Smoking status: Never   Smokeless tobacco: Never  Vaping Use   Vaping Use: Never used  Substance Use Topics   Alcohol use: No   Drug use: No    Home Medications Prior to Admission medications   Medication Sig Start Date End Date Taking? Authorizing Provider  Accu-Chek Softclix Lancets lancets Use as instructed 02/14/21    Lilland, Alana, DO  atorvastatin (LIPITOR) 20 MG tablet Take 1 tablet (20 mg total) by mouth daily. 05/30/21   Lilland, Alana, DO  Blood Glucose Monitoring Suppl (ACCU-CHEK GUIDE) w/Device KIT 1 Device by Does not apply route daily. 02/14/21   Lilland, Alana, DO  citalopram (CELEXA) 40 MG tablet Take 1 tablet (40 mg total) by mouth daily. 05/30/21   Lilland, Alana, DO  Dulaglutide (TRULICITY) 6.28 BT/5.1VO SOPN Inject 0.75 mg into the skin once a week. Inject once every Thursday. 02/14/21   Lilland, Alana, DO  ferrous sulfate 325 (65 FE) MG tablet Take 1 tablet (325 mg total) by mouth daily with breakfast. If having stomach/GI issues, can take every other day. 03/23/21   Lilland, Alana, DO  glucose blood (ACCU-CHEK GUIDE) test strip Use as instructed 02/14/21   Lilland, Alana, DO  hydrOXYzine (ATARAX/VISTARIL) 25 MG tablet Take 1 tablet (25 mg total) by mouth 3 (three) times daily as needed. 05/30/21   Lilland, Alana, DO  metFORMIN (GLUCOPHAGE-XR) 500 MG 24 hr tablet TAKE 1 TABLET(500 MG) BY MOUTH DAILY WITH SUPPER 03/14/21   Lilland, Alana, DO  pantoprazole (PROTONIX) 40 MG tablet TAKE 1 TABLET(40 MG) BY MOUTH DAILY 01/29/21   Lilland, Alana, DO    Allergies    Patient has no known allergies.  Review of Systems   Review of Systems  Constitutional:  Negative for chills and fever.  Respiratory:  Negative for shortness of breath.   Cardiovascular:  Negative for chest pain.  Gastrointestinal:  Negative for abdominal pain, nausea and vomiting.  Genitourinary:  Negative for dysuria, vaginal bleeding and vaginal discharge.  All other systems reviewed and are negative.  Physical Exam Updated Vital Signs BP (!) 148/86 (BP Location: Right Arm)   Pulse 70   Temp 98.1 F (36.7 C) (Oral)   Resp 17   LMP  (LMP Unknown)   SpO2 100%   Physical Exam Vitals and nursing note reviewed.  Constitutional:      General: She is not in acute distress.    Appearance: She is well-developed.  HENT:     Head:  Normocephalic and atraumatic.  Eyes:     General:        Right eye: No discharge.        Left eye: No discharge.     Conjunctiva/sclera: Conjunctivae normal.  Cardiovascular:     Rate and Rhythm: Normal rate.  Pulmonary:     Effort: Pulmonary effort is normal.  Abdominal:     General: There is no distension.     Palpations: Abdomen is soft.     Tenderness: There is no abdominal tenderness. There is no guarding or rebound.  Neurological:     Mental Status: She is alert.     Comments: Clear speech.   Psychiatric:        Behavior: Behavior normal.        Thought Content: Thought content normal.    ED Results / Procedures / Treatments   Labs (all labs ordered are listed, but only abnormal results are displayed) Labs Reviewed  WET PREP, GENITAL - Abnormal; Notable for the following components:      Result Value   Clue Cells Wet Prep HPF POC PRESENT (*)    All other components within normal limits  RPR  HIV ANTIBODY (ROUTINE TESTING W REFLEX)  GC/CHLAMYDIA PROBE AMP ([place]) NOT AT [facility]    EKG None  Radiology No results found.  Procedures Procedures   Medications Ordered in ED Medications - No data to display  ED Course  I have reviewed the triage vital signs and the nursing notes.  Pertinent labs & imaging results that were available during my care of the patient were reviewed by me and considered in my medical decision making (see chart for details).    MDM Rules/Calculators/A&P                           Patient presents to the ED with request for STD testing.  Nontoxic, BP mildly elevated vitals otherwise unremarkable.  Asymptomatic- educated on emergency department not being the appropriate setting for future asymptomatic screening type testing, STI testing obtained & pending. Clue cells present, no abnormal sxs, no trich/yeast. Recommended health dept or PCP follow up and for future similar concerns. Discussed abstinence with tests pending and if  positive need to inform sexual partners.   I discussed results, treatment plan, need for follow-up, and return precautions with the patient. Provided opportunity for questions, patient confirmed understanding and is in agreement with plan.  Final Clinical Impression(s) / ED Diagnoses Final diagnoses:  Screen for STD (sexually transmitted disease)    Rx / DC Orders ED Discharge Orders     None        Leafy Kindle 07/02/21 2337    Tegeler, Gwenyth Allegra, MD 07/03/21 [phone]

## 2021-07-02 NOTE — ED Provider Notes (Signed)
[facility]   MRN: [id number] DOB: [birth year]  Subjective:   Jasmin Collins is a 55 y.o. female presenting for possible exposure to an STI.  Patient is having sex with a new female partner, he let her know that he started to have penile discharge in the past couple days.  She wants to get checked to make sure she does not have anything from him. Denies fever, n/v, abdominal pain, pelvic pain, rashes, dysuria, urinary frequency, hematuria, vaginal discharge.    No current facility-administered medications for this encounter.  Current Outpatient Medications:    Accu-Chek Softclix Lancets lancets, Use as instructed, Disp: 100 each, Rfl: 12   atorvastatin (LIPITOR) 20 MG tablet, Take 1 tablet (20 mg total) by mouth daily., Disp: 30 tablet, Rfl: 3   Blood Glucose Monitoring Suppl (ACCU-CHEK GUIDE) w/Device KIT, 1 Device by Does not apply route daily., Disp: 1 kit, Rfl: 0   citalopram (CELEXA) 40 MG tablet, Take 1 tablet (40 mg total) by mouth daily., Disp: 30 tablet, Rfl: 3   Dulaglutide (TRULICITY) 2.33 AQ/7.6AU SOPN, Inject 0.75 mg into the skin once a week. Inject once every Thursday., Disp: 0.5 mL, Rfl: 3   ferrous sulfate 325 (65 FE) MG tablet, Take 1 tablet (325 mg total) by mouth daily with breakfast. If having stomach/GI issues, can take every other day., Disp: 30 tablet, Rfl: 3   glucose blood (ACCU-CHEK GUIDE) test strip, Use as instructed, Disp: 100 each, Rfl: 12   hydrOXYzine (ATARAX/VISTARIL) 25 MG tablet, Take 1 tablet (25 mg total) by mouth 3 (three) times daily as needed., Disp: 30 tablet, Rfl: 0   metFORMIN (GLUCOPHAGE-XR) 500 MG 24 hr tablet, TAKE 1 TABLET(500 MG) BY MOUTH DAILY WITH SUPPER, Disp: 60 tablet, Rfl: 3   pantoprazole (PROTONIX) 40 MG tablet, TAKE 1 TABLET(40 MG) BY MOUTH DAILY, Disp: 30 tablet, Rfl: 3   No Known Allergies  Past Medical History:  Diagnosis Date   Anxiety    Diabetes mellitus without complication (HCC)    GERD (gastroesophageal  reflux disease)      History reviewed. No pertinent surgical history.  Family History  Problem Relation Age of Onset   Hypertension Mother     Social History   Tobacco Use   Smoking status: Never   Smokeless tobacco: Never  Vaping Use   Vaping Use: Never used  Substance Use Topics   Alcohol use: No   Drug use: No    ROS   Objective:   Vitals: BP (!) 159/90 (BP Location: Left Arm)   Pulse 70   Temp 98.1 F (36.7 C) (Oral)   Resp 18   LMP  (LMP Unknown)   SpO2 98%   Physical Exam Constitutional:      General: She is not in acute distress.    Appearance: Normal appearance. She is well-developed. She is not ill-appearing.  HENT:     Head: Normocephalic and atraumatic.     Nose: Nose normal.     Mouth/Throat:     Mouth: Mucous membranes are moist.     Pharynx: Oropharynx is clear.  Eyes:     General: No scleral icterus.    Extraocular Movements: Extraocular movements intact.     Pupils: Pupils are equal, round, and reactive to light.  Cardiovascular:     Rate and Rhythm: Normal rate.  Pulmonary:     Effort: Pulmonary effort is normal.  Skin:    General: Skin is warm and dry.  Neurological:  General: No focal deficit present.     Mental Status: She is alert and oriented to person, place, and time.  Psychiatric:        Mood and Affect: Mood normal.        Behavior: Behavior normal.    Assessment and Plan :   PDMP not reviewed this encounter.  1. Possible exposure to STD   2. Unprotected sex     Patient is asymptomatic.  We will treat as appropriate based on the lab results.   Jaynee Eagles, [place] 07/02/21 8375

## 2021-07-02 NOTE — ED Triage Notes (Signed)
Pt states current sexual partner contacted her with concerns for penile discharge. States the female has not been tested for STI but pt would like to be screened. At this time asymptomatic. Last encounter with female partner approx 1 week ago.

## 2021-07-03 ENCOUNTER — Telehealth: Payer: Self-pay | Admitting: Family Medicine

## 2021-07-03 LAB — HIV ANTIBODY (ROUTINE TESTING W REFLEX): HIV Screen 4th Generation wRfx: NONREACTIVE

## 2021-07-03 LAB — CERVICOVAGINAL ANCILLARY ONLY
Chlamydia: NEGATIVE
Comment: NEGATIVE
Comment: NEGATIVE
Comment: NORMAL
Neisseria Gonorrhea: POSITIVE — AB
Trichomonas: NEGATIVE

## 2021-07-03 LAB — RPR: RPR Ser Ql: NONREACTIVE

## 2021-07-03 NOTE — Telephone Encounter (Signed)
Routed message to PCP. Adeleine Pask, CMA  

## 2021-07-03 NOTE — Telephone Encounter (Signed)
Patient would like for Dr. Oleh Genin to call her to discuss some testing and results she received last night 07/02/21 at [facility].   The best call back is [phone].

## 2021-07-04 ENCOUNTER — Telehealth: Payer: Self-pay | Admitting: Family Medicine

## 2021-07-04 ENCOUNTER — Encounter: Payer: Self-pay | Admitting: Family Medicine

## 2021-07-04 ENCOUNTER — Ambulatory Visit (INDEPENDENT_AMBULATORY_CARE_PROVIDER_SITE_OTHER): Payer: Medicaid Other

## 2021-07-04 ENCOUNTER — Other Ambulatory Visit: Payer: Self-pay

## 2021-07-04 DIAGNOSIS — A549 Gonococcal infection, unspecified: Secondary | ICD-10-CM | POA: Diagnosis present

## 2021-07-04 LAB — GC/CHLAMYDIA PROBE AMP (~~LOC~~) NOT AT ARMC
Chlamydia: NEGATIVE
Comment: NEGATIVE
Comment: NORMAL
Neisseria Gonorrhea: POSITIVE — AB

## 2021-07-04 MED ORDER — CEFTRIAXONE SODIUM 250 MG IJ SOLR
250.0000 mg | Freq: Once | INTRAMUSCULAR | Status: AC
  Administered 2021-07-04: 250 mg via INTRAMUSCULAR

## 2021-07-04 NOTE — Progress Notes (Signed)
Patient in nurse clinic today for STD treatment of gonorrhea    Patient advised to abstain from sex for 7-10 days after treatment of self and partner.    Ceftriaxone 500 mg per Dr. Marica Otter orders  Patient observed 15 minutes in office.  No reaction noted.   Patient to follow up in 2-3 months for re-screening.     STD report form fax completed and faxed to [facility] Department at [phone] (STD department).     Talbot Grumbling, RN

## 2021-07-04 NOTE — Telephone Encounter (Signed)
Called patient regarding positive testing for gonorrhea at an urgent care. Patient was instructed to go to another urgent care as there was no physician present to be able to administer the treatment. Patient would prefer to be treated at our clinic. Discussed with Lona Millard, RN running the nursing clinic and patient was scheduled for a 9:30am appointment for treatment with Ceftriaxone '500mg'$  IM x1 dose. Patient made aware of appointment.   Chee Dimon, DO

## 2021-07-31 ENCOUNTER — Other Ambulatory Visit: Payer: Self-pay | Admitting: Family Medicine

## 2021-07-31 DIAGNOSIS — D509 Iron deficiency anemia, unspecified: Secondary | ICD-10-CM

## 2021-08-14 ENCOUNTER — Telehealth: Payer: Self-pay | Admitting: Family Medicine

## 2021-08-14 DIAGNOSIS — D509 Iron deficiency anemia, unspecified: Secondary | ICD-10-CM

## 2021-08-14 DIAGNOSIS — M545 Low back pain, unspecified: Secondary | ICD-10-CM

## 2021-08-14 NOTE — Telephone Encounter (Signed)
Pt states she needs letter stating why she takes anxiety medication, and that is she in compliance.  Need today.  Spoke with the [employer] advising may not be able to provide today but will call patient when available. Pt Ph # [phone]

## 2021-08-15 MED ORDER — FERROUS SULFATE 325 (65 FE) MG PO TABS: ORAL_TABLET | ORAL | 3 refills | Status: DC

## 2021-08-15 MED ORDER — TIZANIDINE HCL 4 MG PO TABS: 4.0000 mg | ORAL_TABLET | Freq: Three times a day (TID) | ORAL | 0 refills | Status: DC | PRN

## 2021-08-15 NOTE — Telephone Encounter (Signed)
Called patient and confirmed identity using 2 identifiers. Patient confirmed that she is need a letter to her probation officer stating why she is taking her anxiety medication and that she is in compliance with it. Patient has been compliant and has had several discussions regarding appropriate use, I will send the letter for her.   Patient also notes that she needs a refill of her iron pills. She also is requesting a refill of tizanidine as her back pain is not controlled. She saw a provider at [facility] for pain management and was not satisfied with the service as she requested x-rays of her back, which was denied. She also states that she did a trial of 2 different medications and PT with no improvement in the symptoms. Patient is requesting to see a different provider in the same company but at another location and has already made contact with the office, who stated she would need another referral. Discussed that if the provider did not feel it was appropriate to do the imagine, another provider may also feel the same way. Also counseled on concern of switching pain management physicians and that we can consider it this time but it would not be in best care to continue switching physicians and would not be appropriate. Patient to schedule appointment with me in the next 2-4 weeks for routine follow-up and further discussion.   PDMP was reviewed with a score of 0, prescribed a short course of tizanidine to bridge patient to appointments.    Bryonna Sundby, DO

## 2021-09-01 ENCOUNTER — Other Ambulatory Visit: Payer: Self-pay | Admitting: Family Medicine

## 2021-09-01 DIAGNOSIS — E785 Hyperlipidemia, unspecified: Secondary | ICD-10-CM

## 2021-09-01 DIAGNOSIS — E1169 Type 2 diabetes mellitus with other specified complication: Secondary | ICD-10-CM

## 2021-10-15 ENCOUNTER — Encounter: Payer: Self-pay | Admitting: Internal Medicine

## 2021-10-22 ENCOUNTER — Encounter: Payer: Self-pay | Admitting: Family Medicine

## 2021-10-26 ENCOUNTER — Other Ambulatory Visit: Payer: Self-pay | Admitting: Family Medicine

## 2021-10-26 DIAGNOSIS — M545 Low back pain, unspecified: Secondary | ICD-10-CM

## 2021-10-26 DIAGNOSIS — F419 Anxiety disorder, unspecified: Secondary | ICD-10-CM

## 2021-10-26 DIAGNOSIS — G479 Sleep disorder, unspecified: Secondary | ICD-10-CM

## 2021-10-26 DIAGNOSIS — E1169 Type 2 diabetes mellitus with other specified complication: Secondary | ICD-10-CM

## 2021-10-26 DIAGNOSIS — E119 Type 2 diabetes mellitus without complications: Secondary | ICD-10-CM

## 2021-10-29 ENCOUNTER — Other Ambulatory Visit: Payer: Self-pay | Admitting: Family Medicine

## 2021-11-04 ENCOUNTER — Ambulatory Visit (INDEPENDENT_AMBULATORY_CARE_PROVIDER_SITE_OTHER): Payer: Medicaid Other | Admitting: Internal Medicine

## 2021-11-04 ENCOUNTER — Other Ambulatory Visit: Payer: Self-pay | Admitting: Family Medicine

## 2021-11-04 ENCOUNTER — Encounter: Payer: Self-pay | Admitting: Internal Medicine

## 2021-11-04 VITALS — BP 124/72 | HR 84 | Ht 63.0 in | Wt 223.0 lb

## 2021-11-04 DIAGNOSIS — Z1231 Encounter for screening mammogram for malignant neoplasm of breast: Secondary | ICD-10-CM

## 2021-11-04 DIAGNOSIS — D509 Iron deficiency anemia, unspecified: Secondary | ICD-10-CM | POA: Diagnosis not present

## 2021-11-04 DIAGNOSIS — Z1211 Encounter for screening for malignant neoplasm of colon: Secondary | ICD-10-CM

## 2021-11-04 MED ORDER — NA SULFATE-K SULFATE-MG SULF 17.5-3.13-1.6 GM/177ML PO SOLN: 1.0000 | ORAL | 0 refills | Status: DC

## 2021-11-04 NOTE — Patient Instructions (Signed)
If you are age 53 or younger, your body mass index should be between 19-25. Your Body mass index is 39.5 kg/m. If this is out of the aformentioned range listed, please consider follow up with your Primary Care Provider.  ________________________________________________________  The [facility] providers would like to encourage you to use [facility] to communicate with providers for non-urgent requests or questions.  Due to long hold times on the telephone, sending your provider a message by [facility] may be a faster and more efficient way to get a response.  Please allow 48 business hours for a response.  Please remember that this is for non-urgent requests.  _______________________________________________________  Jasmin Collins have been scheduled for an endoscopy and colonoscopy. Please follow the written instructions given to you at your visit today. Please pick up your prep supplies at the pharmacy within the next 1-3 days. If you use inhalers (even only as needed), please bring them with you on the day of your procedure.  Due to recent changes in healthcare laws, you may see the results of your imaging and laboratory studies on MyChart before your provider has had a chance to review them.  We understand that in some cases there may be results that are confusing or concerning to you. Not all laboratory results come back in the same time frame and the provider may be waiting for multiple results in order to interpret others.  Please give [place] 48 hours in order for your provider to thoroughly review all the results before contacting the office for clarification of your results.   Thank you for entrusting me with your care and choosing [facility].  Dr Lorenso Courier

## 2021-11-04 NOTE — Progress Notes (Signed)
Chief Complaint: IDA  HPI : 55 year old with history of DM, GERD, and anxiety presents with IDA  Patient presents with iron deficiency anemia.  She has been following with her PCP regarding a low hemoglobin of 8.6 seen in 03/2021.  She has had history of anemia in the remote past, but this was a recent worsening of her anemia.  Her anemia was found to be due to iron deficiency. She is currently on iron supplements. Her last Hb in 07/2021 had improved after being on iron supplementation. Denies any menstrual periods.  Denies any melena, hematochezia, weight loss, changes in bowel habits.  Denies family history of colon cancer.  Denies nausea, vomiting, trouble swallowing, diarrhea, constipation, abdominal pain.  She is to have issues with GERD when she would even have nausea and vomiting, but this has now completely resolved.  She is not currently on any medications for GERD.  Denies prior EGD or colonoscopy. She is not on any blood thinners.  She works as a Environmental education officer.   Past Medical History:  Diagnosis Date   Anxiety    Diabetes mellitus without complication (HCC)    GERD (gastroesophageal reflux disease)      History reviewed. No pertinent surgical history. Family History  Problem Relation Age of Onset   Hypertension Mother    Social History   Tobacco Use   Smoking status: Never   Smokeless tobacco: Never  Vaping Use   Vaping Use: Never used  Substance Use Topics   Alcohol use: No   Drug use: No   Current Outpatient Medications  Medication Sig Dispense Refill   Accu-Chek Softclix Lancets lancets Use as instructed 100 each 12   atorvastatin (LIPITOR) 20 MG tablet TAKE 1 TABLET(20 MG) BY MOUTH DAILY 30 tablet 3   Blood Glucose Monitoring Suppl (ACCU-CHEK GUIDE) w/Device KIT 1 Device by Does not apply route daily. 1 kit 0   citalopram (CELEXA) 40 MG tablet TAKE 1 TABLET(40 MG) BY MOUTH DAILY 30 tablet 3   ferrous sulfate (FEROSUL) 325 (65 FE) MG tablet TAKE 1  TABLET BY MOUTH DAILY WITH BREAKFAST. IF HAVING STOMACH/GI ISSUES, CAN TAKE EVERY OTHER DAY 90 tablet 3   glucose blood (ACCU-CHEK GUIDE) test strip Use as instructed 100 each 12   hydrOXYzine (ATARAX/VISTARIL) 25 MG tablet Take 1 tablet (25 mg total) by mouth 3 (three) times daily as needed. 30 tablet 0   metFORMIN (GLUCOPHAGE-XR) 500 MG 24 hr tablet TAKE 1 TABLET(500 MG) BY MOUTH DAILY WITH SUPPER 60 tablet 3   pantoprazole (PROTONIX) 40 MG tablet TAKE 1 TABLET(40 MG) BY MOUTH DAILY 30 tablet 3   tiZANidine (ZANAFLEX) 4 MG tablet Take 1 tablet (4 mg total) by mouth every 8 (eight) hours as needed for muscle spasms. 20 tablet 0   No current facility-administered medications for this visit.   No Known Allergies   Review of Systems: All systems reviewed and negative except where noted in HPI.   Physical Exam: BP 124/72   Pulse 84   Ht _0  (1.6 m)   Wt 223 lb (101.2 kg)   LMP  (LMP Unknown)   BMI 39.50 kg/m  Constitutional: Pleasant,well-developed, female in no acute distress. HEENT: Normocephalic and atraumatic. Conjunctivae are normal. No scleral icterus. Cardiovascular: Normal rate, regular rhythm.  Pulmonary/chest: Effort normal and breath sounds normal. No wheezing, rales or rhonchi. Abdominal: Soft, nondistended, nontender. Bowel sounds active throughout. There are no masses palpable. No hepatomegaly. Extremities: No edema Neurological: Alert  and oriented to person place and time. Skin: Skin is warm and dry. No rashes noted. Psychiatric: Normal mood and affect. Behavior is normal.  Labs 03/2021: CBC with Hb 8.6. Ferritin 6  Labs 05/2021: Last CBC with Hb 11.6. Ferritin 22  Pelvic U/S 05/21/15: IMPRESSION: Uterine fibroids.  There is a dominant submucosal fibroid. Moderate amount of fluid within the endometrial cavity. Etiology for the endometrial fluid is unknown but could be related to the dominant submucosal fibroid. Mildly complex right ovarian cyst.  ASSESSMENT AND  PLAN:  IDA Colon cancer screening Patient presents with iron deficiency anemia with a low hemoglobin of 8.6 and a ferritin of 6 in 03/2021.  With iron supplementation, her hemoglobin has improved up to 11.6.  We will plan for EGD and colonoscopy for further evaluation for a source of her iron deficiency anemia since patient does not have any alternative explanation for IDA (patient is no longer having any menstrual periods) - EGD/colonoscopy [facility]  Christia Reading, MD

## 2021-11-06 ENCOUNTER — Encounter: Payer: Self-pay | Admitting: Family Medicine

## 2021-11-06 ENCOUNTER — Other Ambulatory Visit (HOSPITAL_COMMUNITY)
Admission: RE | Admit: 2021-11-06 | Discharge: 2021-11-06 | Disposition: A | Discharge status: Home / Self Care | Payer: Medicaid Other | Source: Ambulatory Visit | Attending: Family Medicine | Admitting: Family Medicine

## 2021-11-06 ENCOUNTER — Other Ambulatory Visit: Payer: Self-pay

## 2021-11-06 ENCOUNTER — Ambulatory Visit: Payer: Medicaid Other | Admitting: Family Medicine

## 2021-11-06 VITALS — BP 117/82 | HR 77 | Ht 63.0 in | Wt 223.2 lb

## 2021-11-06 DIAGNOSIS — N898 Other specified noninflammatory disorders of vagina: Secondary | ICD-10-CM | POA: Diagnosis not present

## 2021-11-06 DIAGNOSIS — Z202 Contact with and (suspected) exposure to infections with a predominantly sexual mode of transmission: Secondary | ICD-10-CM

## 2021-11-06 DIAGNOSIS — A599 Trichomoniasis, unspecified: Secondary | ICD-10-CM | POA: Diagnosis present

## 2021-11-06 LAB — POCT WET PREP (WET MOUNT): Clue Cells Wet Prep Whiff POC: NEGATIVE

## 2021-11-06 MED ORDER — METRONIDAZOLE 500 MG PO TABS: 500.0000 mg | ORAL_TABLET | Freq: Two times a day (BID) | ORAL | 0 refills | Status: AC

## 2021-11-06 NOTE — Patient Instructions (Addendum)
It was nice seeing you today!  Abstain from sexual activity until results are back. Recommend condom use to prevent STIs.  Results will be available on MyChart.  Stay well, Zola Button, MD [place] [phone]

## 2021-11-06 NOTE — Progress Notes (Signed)
    SUBJECTIVE:   CHIEF COMPLAINT / HPI: vaginal discharge  Patient reports malodorous yellow vaginal discharge for 1 day. Feels some stinging. Last sexual intercourse 2 weeks ago. She states she has been with 1 partner, no new partners recently. Denies fever, abdominal pain. She is post-menopausal.  PERTINENT  PMH / PSH: anxiety, T2DM  OBJECTIVE:   BP 117/82   Pulse 77   Ht 5\' 3"  (1.6 m)   Wt 223 lb 3.2 oz (101.2 kg)   LMP  (LMP Unknown)   SpO2 100%   BMI 39.54 kg/m   General: alert, NAD GU: chaperone present. Thick whitish yellow discharge noted in the vaginal vault. Cervix appears mildly erythematous. No CMT or adnexal tenderness.  ASSESSMENT/PLAN:   Trichomoniasis Called patient with results, sent in metronidazole 500 mg BID x 7 days. Counseled to avoid intercourse until treatment completed and recommended partner get treated. Counseled on condom use. May be a candidate for PrEP, MyChart message sent with information. GC/chlamydia, HIV, RPR pending.    Zola Button, MD [place]

## 2021-11-06 NOTE — Progress Notes (Signed)
ce

## 2021-11-07 LAB — CERVICOVAGINAL ANCILLARY ONLY
Chlamydia: NEGATIVE
Comment: NEGATIVE
Comment: NEGATIVE
Comment: NORMAL
Neisseria Gonorrhea: NEGATIVE
Trichomonas: POSITIVE — AB

## 2021-11-07 LAB — RPR: RPR Ser Ql: NONREACTIVE

## 2021-11-07 LAB — HIV ANTIBODY (ROUTINE TESTING W REFLEX): HIV Screen 4th Generation wRfx: NONREACTIVE

## 2021-11-11 ENCOUNTER — Other Ambulatory Visit: Payer: Self-pay

## 2021-11-11 ENCOUNTER — Encounter: Payer: Self-pay | Admitting: Family Medicine

## 2021-11-11 ENCOUNTER — Ambulatory Visit: Payer: Medicaid Other | Admitting: Family Medicine

## 2021-11-11 VITALS — BP 127/79 | HR 78 | Ht 63.0 in | Wt 223.8 lb

## 2021-11-11 DIAGNOSIS — E119 Type 2 diabetes mellitus without complications: Secondary | ICD-10-CM | POA: Diagnosis not present

## 2021-11-11 DIAGNOSIS — E1169 Type 2 diabetes mellitus with other specified complication: Secondary | ICD-10-CM

## 2021-11-11 DIAGNOSIS — Z0000 Encounter for general adult medical examination without abnormal findings: Secondary | ICD-10-CM

## 2021-11-11 DIAGNOSIS — E785 Hyperlipidemia, unspecified: Secondary | ICD-10-CM

## 2021-11-11 LAB — POCT GLYCOSYLATED HEMOGLOBIN (HGB A1C): HbA1c, POC (controlled diabetic range): 6.2 % (ref 0.0–7.0)

## 2021-11-11 NOTE — Progress Notes (Signed)
    SUBJECTIVE:   CHIEF COMPLAINT / HPI:   T2DM - Checking BG at home: No - Medications: Metformin 1000 mg twice daily, atorvastatin - Compliance: Taking all medications as prescribed - Diet: Overall doing well with her diet - eye exam: Reports she has had it done this year, still awaiting the records - foot exam: Never done, will do today - microalbumin: 10 (on 01/23/2021) - denies symptoms of hypoglycemia, polyuria, polydipsia, numbness extremities, foot ulcers/trauma   PERTINENT  PMH / PSH: Reviewed  OBJECTIVE:   BP 127/79   Pulse 78   Ht 5\' 3"  (1.6 m)   Wt 223 lb 12.8 oz (101.5 kg)   LMP  (LMP Unknown)   SpO2 100%   BMI 39.64 kg/m   General: NAD, well-appearing, well-nourished Respiratory: No respiratory distress, breathing comfortably, able to speak in full sentences Skin: warm and dry, no rashes noted on exposed skin. Buprenorphine patch on left shoulder Psych: Appropriate affect and mood Diabetic Foot Exam - Simple   Simple Foot Form Diabetic Foot exam was performed with the following findings: Yes 11/11/2021  1:49 PM  Visual Inspection No deformities, no ulcerations, no other skin breakdown bilaterally: Yes Sensation Testing Intact to touch and monofilament testing bilaterally: Yes Pulse Check Posterior Tibialis and Dorsalis pulse intact bilaterally: Yes Comments Hair present on foot      ASSESSMENT/PLAN:   Type 2 diabetes mellitus without complication, without long-term current use of insulin (HCC) A1c well controlled currently at 6.2.  Only medications are metformin and atorvastatin.  Patient intermittently will have diarrhea associated with the medication, but feels she is tolerating it well. - Continue current regimen - Lipid panel  Healthcare maintenance - Mammogram scheduled for 12/16 - Colonoscopy scheduled for 1/10 - Patient will complete Pap smear after the holidays - Continue to encourage further vaccination     Rise Patience, [place]

## 2021-11-11 NOTE — Assessment & Plan Note (Signed)
-   Mammogram scheduled for 12/16 - Colonoscopy scheduled for 1/10 - Patient will complete Pap smear after the holidays - Continue to encourage further vaccination

## 2021-11-11 NOTE — Assessment & Plan Note (Signed)
A1c well controlled currently at 6.2.  Only medications are metformin and atorvastatin.  Patient intermittently will have diarrhea associated with the medication, but feels she is tolerating it well. - Continue current regimen - Lipid panel

## 2021-11-11 NOTE — Patient Instructions (Signed)
Your A1c today was 6.2, which is great as your goal is <7.  We are going to get a cholesterol check on you today to make sure that we do not change to change your medication for your cholesterol.

## 2021-11-12 ENCOUNTER — Telehealth: Payer: Self-pay | Admitting: Family Medicine

## 2021-11-12 DIAGNOSIS — E1169 Type 2 diabetes mellitus with other specified complication: Secondary | ICD-10-CM

## 2021-11-12 DIAGNOSIS — E785 Hyperlipidemia, unspecified: Secondary | ICD-10-CM

## 2021-11-12 LAB — LIPID PANEL
Chol/HDL Ratio: 3.7 ratio (ref 0.0–4.4)
Cholesterol, Total: 160 mg/dL (ref 100–199)
HDL: 43 mg/dL (ref >39)
LDL Chol Calc (NIH): 87 mg/dL (ref 0–99)
Triglycerides: 176 mg/dL — ABNORMAL HIGH (ref 0–149)
VLDL Cholesterol Cal: 30 mg/dL (ref 5–40)

## 2021-11-12 MED ORDER — ATORVASTATIN CALCIUM 40 MG PO TABS: 40.0000 mg | ORAL_TABLET | Freq: Every day | ORAL | 3 refills | Status: DC

## 2021-11-12 NOTE — Telephone Encounter (Signed)
Called patient regarding cholesterol check at recent visit. LDL is still a little bit above the goal of <70. Currently on Atorvastatin 20mg , we will increase this to 40mg  daily. May consider rechecking Direct LDL in 6 months.   Jasmin Dura, DO

## 2021-11-15 ENCOUNTER — Ambulatory Visit: Payer: Medicaid Other

## 2021-11-18 ENCOUNTER — Ambulatory Visit: Payer: Medicaid Other

## 2021-12-06 ENCOUNTER — Ambulatory Visit: Payer: Medicaid Other | Admitting: Family Medicine

## 2021-12-10 ENCOUNTER — Telehealth: Payer: Self-pay | Admitting: *Deleted

## 2021-12-10 ENCOUNTER — Encounter: Payer: Medicaid Other | Admitting: Internal Medicine

## 2021-12-10 NOTE — Telephone Encounter (Signed)
Called the patient to see if she might be able to come in for an earlier appt today with Dr. Lorenso Courier she then informed me that she had the flu. I asked to call back and reschedule with [place] when she is feeling better. Will put in a 1 month recall as well. Dr. Lorenso Courier notified.

## 2021-12-13 NOTE — Telephone Encounter (Signed)
Call to patient.   Explained that I was not able to add her the GI breast center schedule. Pt stated understanding and will reach to them to schedule directly.

## 2022-01-03 ENCOUNTER — Ambulatory Visit: Payer: Medicaid Other

## 2022-01-08 ENCOUNTER — Ambulatory Visit
Admission: RE | Admit: 2022-01-08 | Discharge: 2022-01-08 | Disposition: A | Discharge status: Home / Self Care | Payer: Medicaid Other | Source: Ambulatory Visit | Attending: Family Medicine | Admitting: Family Medicine

## 2022-01-08 DIAGNOSIS — Z1231 Encounter for screening mammogram for malignant neoplasm of breast: Secondary | ICD-10-CM

## 2022-02-03 ENCOUNTER — Encounter: Payer: Self-pay | Admitting: Internal Medicine

## 2022-02-05 ENCOUNTER — Other Ambulatory Visit: Payer: Self-pay | Admitting: Family Medicine

## 2022-02-05 DIAGNOSIS — F419 Anxiety disorder, unspecified: Secondary | ICD-10-CM

## 2022-02-05 DIAGNOSIS — K219 Gastro-esophageal reflux disease without esophagitis: Secondary | ICD-10-CM

## 2022-02-05 NOTE — Telephone Encounter (Signed)
Patient returns call to nurse line regarding refill. Advised patient of rx refill policy. Patient verbalizes understanding, however, expresses concern as she is completely out of medication.  ? ?Talbot Grumbling, RN ? ?

## 2022-02-21 ENCOUNTER — Ambulatory Visit: Payer: Medicaid Other | Admitting: Family Medicine

## 2022-03-02 ENCOUNTER — Other Ambulatory Visit: Payer: Self-pay | Admitting: Family Medicine

## 2022-03-02 DIAGNOSIS — F419 Anxiety disorder, unspecified: Secondary | ICD-10-CM

## 2022-03-04 ENCOUNTER — Ambulatory Visit: Payer: Medicaid Other | Admitting: Family Medicine

## 2022-03-04 NOTE — Progress Notes (Deleted)
? ? ?  SUBJECTIVE:  ? ?CHIEF COMPLAINT / HPI:  ? ?T2DM ?- Checking BG at home: *** ?- Medications: *** ?- Compliance: *** ?- Diet: *** ?- Exercise: *** ?- eye exam: *** ?- foot exam: *** 11/11/2021 ?- microalbumin: ***   ?Lab Results  ?Component Value Date  ? MICROALBUR 10 01/23/2021  ? ?- denies symptoms of hypoglycemia, polyuria, polydipsia, numbness extremities, foot ulcers/trauma ? ? ?PERTINENT  PMH / PSH: *** ? ?OBJECTIVE:  ? ?LMP  (LMP Unknown)   ?*** ? ?ASSESSMENT/PLAN:  ? ?No problem-specific Assessment & Plan notes found for this encounter. ?  ? ? ?Rebel Willcutt, DO ?[place]  ?

## 2022-03-23 ENCOUNTER — Other Ambulatory Visit: Payer: Self-pay | Admitting: Family Medicine

## 2022-03-23 DIAGNOSIS — F419 Anxiety disorder, unspecified: Secondary | ICD-10-CM

## 2022-03-24 ENCOUNTER — Ambulatory Visit: Payer: Medicaid Other | Admitting: Family Medicine

## 2022-03-24 ENCOUNTER — Encounter: Payer: Self-pay | Admitting: Family Medicine

## 2022-03-24 VITALS — BP 134/72 | HR 78 | Ht 63.0 in | Wt 244.2 lb

## 2022-03-24 DIAGNOSIS — E119 Type 2 diabetes mellitus without complications: Secondary | ICD-10-CM

## 2022-03-24 DIAGNOSIS — L659 Nonscarring hair loss, unspecified: Secondary | ICD-10-CM | POA: Diagnosis not present

## 2022-03-24 LAB — POCT GLYCOSYLATED HEMOGLOBIN (HGB A1C): HbA1c, POC (controlled diabetic range): 7.3 % — AB (ref 0.0–7.0)

## 2022-03-24 MED ORDER — TRULICITY 0.75 MG/0.5ML ~~LOC~~ SOAJ: 0.7500 mg | SUBCUTANEOUS | 1 refills | Status: DC

## 2022-03-24 NOTE — Assessment & Plan Note (Signed)
Present on pictures.  Unable to do physical exam due to patient's hairstyle.  Patient will come back for a visit in the next few weeks to follow-up. ?

## 2022-03-24 NOTE — Assessment & Plan Note (Addendum)
A1c 7.3. ?- Stopping metformin due to intolerance ?- Restart Trulicity 0.'75mg'$  ?- Continue to lovastatin 40 mg daily ?- Obtain ophthalmology visit notes from [place] ?- Repeat LDL with next A1c check in 3 months ?

## 2022-03-24 NOTE — Patient Instructions (Addendum)
It was so great seeing you today! Today we discussed the following: ? ?- I sent in your Trulicity prescription, you can stop taking the metformin if you are not tolerating it.  Let me know if you have any issues with obtaining this medication ? ?-Make another appointment so that we can talk about your alopecia, make sure to have your hair done at that time so we can closely evaluate. ? ?-At your next visit, we will go ahead and try to do your Pap smear ? ? ?Please make sure to bring any medications you take to your appointments. If you have any questions or concerns please call the office at [phone].  ? ?

## 2022-03-24 NOTE — Progress Notes (Signed)
? ? ?  SUBJECTIVE:  ? ?CHIEF COMPLAINT / HPI:  ? ?Type 2 diabetes ?Patient reports that the metformin causes her to have diarrhea and she would like to stop taking it.  She would like to restart her Trulicity if she is able to.  Patient does not she saw the ophthalmologist on February 14 at [facility] ? ?Alopecia ?Patient also notes that she has had alopecia that was noticed by her hairdresser when getting her braids placed.  She is not sure when exactly this started and never had anything like this.  She is not sure if the medications are contributing. ? ?PERTINENT  PMH / PSH: Reviewed ? ?OBJECTIVE:  ? ?BP 134/72   Pulse 78   Ht '5\' 3"'$  (1.6 m)   Wt 244 lb 3.2 oz (110.8 kg)   LMP  (LMP Unknown)   SpO2 100%   BMI 43.26 kg/m?   ?General: NAD, well-appearing, well-nourished ?Respiratory: No respiratory distress, breathing comfortably, able to speak in full sentences ?Skin: warm and dry, no rashes noted on exposed skin ?Psych: Appropriate affect and mood ? ?ASSESSMENT/PLAN:  ? ?Type 2 diabetes mellitus without complication, without long-term current use of insulin ([place]) ?A1c 7.3. ?- Stopping metformin due to intolerance ?- Restart Trulicity 0.'75mg'$  ?- Continue to lovastatin 40 mg daily ?- Obtain ophthalmology visit notes from [place] ?- Repeat LDL with next A1c check in 3 months ? ?Alopecia ?Present on pictures.  Unable to do physical exam due to patient's hairstyle.  Patient will come back for a visit in the next few weeks to follow-up. ?  ?Healthcare maintenance ?-Pap smear at next visit ?- Patient declines colonoscopy, consider Cologuard or FIT testing (this was discussed today) ? ?Ahmari Garton, DO ?[place]  ?

## 2022-03-26 LAB — MICROALBUMIN / CREATININE URINE RATIO
Creatinine, Urine: 244.8 mg/dL
Microalb/Creat Ratio: 8 mg/g creat (ref 0–29)
Microalbumin, Urine: 19.3 ug/mL

## 2022-04-07 ENCOUNTER — Other Ambulatory Visit (HOSPITAL_COMMUNITY)
Admission: RE | Admit: 2022-04-07 | Discharge: 2022-04-07 | Disposition: A | Discharge status: Home / Self Care | Payer: Medicaid Other | Source: Ambulatory Visit | Attending: Family Medicine | Admitting: Family Medicine

## 2022-04-07 ENCOUNTER — Encounter: Payer: Self-pay | Admitting: Family Medicine

## 2022-04-07 ENCOUNTER — Ambulatory Visit: Payer: Medicaid Other | Admitting: Family Medicine

## 2022-04-07 VITALS — BP 143/81 | HR 72 | Ht 63.0 in | Wt 242.4 lb

## 2022-04-07 DIAGNOSIS — Z124 Encounter for screening for malignant neoplasm of cervix: Secondary | ICD-10-CM | POA: Diagnosis present

## 2022-04-07 DIAGNOSIS — L659 Nonscarring hair loss, unspecified: Secondary | ICD-10-CM

## 2022-04-07 NOTE — Assessment & Plan Note (Signed)
The pictures patient had showed significant improvement without intervention other than a topical serum.  Discussed with patient our options as her hair was still done up.  We will hold off at this time and reevaluate if there is no improvement or if it begins worsening. ?

## 2022-04-07 NOTE — Progress Notes (Signed)
? ? ?  SUBJECTIVE:  ? ?CHIEF COMPLAINT / HPI:  ? ?Patient initially had a concern for alopecia but notes that since she has started using a serum on her hair she has had significant improvement in her symptoms. ? ?Patient is agreeable to getting her Pap smear today. ? ?PERTINENT  PMH / PSH: Reviewed ? ?OBJECTIVE:  ? ?BP (!) 143/81   Pulse 72   Ht '5\' 3"'$  (1.6 m)   Wt 242 lb 6.4 oz (110 kg)   LMP  (LMP Unknown)   SpO2 100%   BMI 42.94 kg/m?   ?General: NAD, well-appearing, well-nourished ?Respiratory: No respiratory distress, breathing comfortably, able to speak in full sentences ?Skin: warm and dry, no rashes noted on exposed skin ?Psych: Appropriate affect and mood ?Pelvic exam: normal external genitalia, vulva, vagina, cervix, uterus and adnexa, exam chaperoned by April Zimmerman Rumple. ? ? ?ASSESSMENT/PLAN:  ? ?Alopecia ?The pictures patient had showed significant improvement without intervention other than a topical serum.  Discussed with patient our options as her hair was still done up.  We will hold off at this time and reevaluate if there is no improvement or if it begins worsening. ?  ?Cervical cancer screening ?Pap smear done today. ? ?Mukhtar Shams, DO ?[place]  ?

## 2022-04-07 NOTE — Patient Instructions (Addendum)
Since your hair is starting to improve on its own, I do not think we need to do anything right now.  If it starts to become worse or you start feeling you losing hair again then please let me know and we will follow-up then. ? ?The testing for your Pap smear will take several days to come back, I will let you know if there are any abnormal results as well as when your next one will need to be done. ? ? ? ?If there are no other concerns, we will follow-up at your 63-monthvisit to check on your diabetes. ?

## 2022-04-09 LAB — CYTOLOGY - PAP
Comment: NEGATIVE
Diagnosis: NEGATIVE
High risk HPV: NEGATIVE

## 2022-05-06 ENCOUNTER — Encounter: Payer: Self-pay | Admitting: *Deleted

## 2022-05-16 ENCOUNTER — Other Ambulatory Visit: Payer: Self-pay | Admitting: Family Medicine

## 2022-05-31 ENCOUNTER — Other Ambulatory Visit: Payer: Self-pay | Admitting: Family Medicine

## 2022-05-31 DIAGNOSIS — D509 Iron deficiency anemia, unspecified: Secondary | ICD-10-CM

## 2022-07-18 ENCOUNTER — Other Ambulatory Visit: Payer: Self-pay | Admitting: Family Medicine

## 2022-08-22 ENCOUNTER — Ambulatory Visit: Payer: Medicaid Other | Admitting: Student

## 2022-08-22 VITALS — BP 118/70 | HR 84 | Ht 63.0 in | Wt 238.0 lb

## 2022-08-22 DIAGNOSIS — J302 Other seasonal allergic rhinitis: Secondary | ICD-10-CM

## 2022-08-22 MED ORDER — FLUTICASONE PROPIONATE 50 MCG/ACT NA SUSP: 2.0000 | Freq: Every day | NASAL | 6 refills | Status: DC

## 2022-08-22 MED ORDER — CETIRIZINE HCL 10 MG PO TABS: 10.0000 mg | ORAL_TABLET | Freq: Every day | ORAL | 2 refills | Status: DC

## 2022-08-22 NOTE — Assessment & Plan Note (Signed)
Suspect cough 2/2 postnasal drip. Will treat with Zyrtec and Flonase. Discussed prophylactic Zyrtec around this time next year.

## 2022-08-22 NOTE — Progress Notes (Signed)
    SUBJECTIVE:   CHIEF COMPLAINT / HPI:   Cough Present for 1 week.  Had similar symptoms around the same time every year.  Reports also having a runny nose and feeling like she is "wheezing."  No fever, chills, sick symptoms.  Perhaps a little bit of a sore throat.  Does not smoke.  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  OBJECTIVE:   BP 118/70   Pulse 84   Ht '5\' 3"'$  (1.6 m)   Wt 238 lb (108 kg)   LMP  (LMP Unknown)   SpO2 99%   BMI 42.16 kg/m   Gen: Well-appearing HENT: Nasal turbinates boggy, rhinorrhea/congestion present. Oropharynx erythematous, without exudate Neck: No LAD Pulm: Lungs CTAB, normal WOB on RA Skin: Warm and dry, without rash or excoriation  ASSESSMENT/PLAN:   Seasonal allergies Suspect cough 2/2 postnasal drip. Will treat with Zyrtec and Flonase. Discussed prophylactic Zyrtec around this time next year.      Jasmin Dubonnet, MD [place]

## 2022-08-22 NOTE — Patient Instructions (Signed)
Jasmin Collins,  It is such a joy to meet you today.  I think that all of your symptoms are from seasonal allergies.  I would like to treat these with a daily pill called cetirizine or Zyrtec.  This is an over-the-counter medicine but I will prescribe it to the pharmacist can help you get the right thing.  I want to check this every day for the next few weeks to get you through allergy season.  Next year, you may want to consider starting to take this around early September to try and prevent the symptoms.  I am also sending in some Flonase which is a nasal spray that should help to clear out and dry up the secretions that are causing your cough.  Pearla Dubonnet, MD

## 2022-09-17 ENCOUNTER — Other Ambulatory Visit: Payer: Self-pay | Admitting: Family Medicine

## 2022-09-17 DIAGNOSIS — F419 Anxiety disorder, unspecified: Secondary | ICD-10-CM

## 2022-09-26 ENCOUNTER — Telehealth: Payer: Medicaid Other | Admitting: Physician Assistant

## 2022-09-26 ENCOUNTER — Encounter: Payer: Self-pay | Admitting: Student

## 2022-09-26 DIAGNOSIS — B9689 Other specified bacterial agents as the cause of diseases classified elsewhere: Secondary | ICD-10-CM

## 2022-09-26 DIAGNOSIS — N760 Acute vaginitis: Secondary | ICD-10-CM | POA: Diagnosis not present

## 2022-09-26 MED ORDER — METRONIDAZOLE 500 MG PO TABS: 500.0000 mg | ORAL_TABLET | Freq: Two times a day (BID) | ORAL | 0 refills | Status: AC

## 2022-09-26 NOTE — Progress Notes (Signed)
E-Visit for Vaginal Symptoms  We are sorry that you are not feeling well. Here is how we plan to help! Based on what you shared with me it looks like you: May have a vaginosis due to bacteria  Vaginosis is an inflammation of the vagina that can result in discharge, itching and pain. The cause is usually a change in the normal balance of vaginal bacteria or an infection. Vaginosis can also result from reduced estrogen levels after menopause.  The most common causes of vaginosis are:   Bacterial vaginosis which results from an overgrowth of one on several organisms that are normally present in your vagina.   Yeast infections which are caused by a naturally occurring fungus called candida.   Vaginal atrophy (atrophic vaginosis) which results from the thinning of the vagina from reduced estrogen levels after menopause.   Trichomoniasis which is caused by a parasite and is commonly transmitted by sexual intercourse.  Factors that increase your risk of developing vaginosis include: Medications, such as antibiotics and steroids Uncontrolled diabetes Use of hygiene products such as bubble bath, vaginal spray or vaginal deodorant Douching Wearing damp or tight-fitting clothing Using an intrauterine device (IUD) for birth control Hormonal changes, such as those associated with pregnancy, birth control pills or menopause Sexual activity Having a sexually transmitted infection  Your treatment plan is Metronidazole or Flagyl 500mg twice a day for 7 days.  I have electronically sent this prescription into the pharmacy that you have chosen.  Be sure to take all of the medication as directed. Stop taking any medication if you develop a rash, tongue swelling or shortness of breath. Mothers who are breast feeding should consider pumping and discarding their breast milk while on these antibiotics. However, there is no consensus that infant exposure at these doses would be harmful.  Remember that  medication creams can weaken latex condoms. .   HOME CARE:  Good hygiene may prevent some types of vaginosis from recurring and may relieve some symptoms:  Avoid baths, hot tubs and whirlpool spas. Rinse soap from your outer genital area after a shower, and dry the area well to prevent irritation. Don't use scented or harsh soaps, such as those with deodorant or antibacterial action. Avoid irritants. These include scented tampons and pads. Wipe from front to back after using the toilet. Doing so avoids spreading fecal bacteria to your vagina.  Other things that may help prevent vaginosis include:  Don't douche. Your vagina doesn't require cleansing other than normal bathing. Repetitive douching disrupts the normal organisms that reside in the vagina and can actually increase your risk of vaginal infection. Douching won't clear up a vaginal infection. Use a latex condom. Both female and female latex condoms may help you avoid infections spread by sexual contact. Wear cotton underwear. Also wear pantyhose with a cotton crotch. If you feel comfortable without it, skip wearing underwear to bed. Yeast thrives in moist environments Your symptoms should improve in the next day or two.  GET HELP RIGHT AWAY IF:  You have pain in your lower abdomen ( pelvic area or over your ovaries) You develop nausea or vomiting You develop a fever Your discharge changes or worsens You have persistent pain with intercourse You develop shortness of breath, a rapid pulse, or you faint.  These symptoms could be signs of problems or infections that need to be evaluated by a medical provider now.  MAKE SURE YOU   Understand these instructions. Will watch your condition. Will get help right   away if you are not doing well or get worse.  Thank you for choosing an e-visit.  Your e-visit answers were reviewed by a board certified advanced clinical practitioner to complete your personal care plan. Depending upon the  condition, your plan could have included both over the counter or prescription medications.  Please review your pharmacy choice. Make sure the pharmacy is open so you can pick up prescription now. If there is a problem, you may contact your provider through MyChart messaging and have the prescription routed to another pharmacy.  Your safety is important to us. If you have drug allergies check your prescription carefully.   For the next 24 hours you can use MyChart to ask questions about today's visit, request a non-urgent call back, or ask for a work or school excuse. You will get an email in the next two days asking about your experience. I hope that your e-visit has been valuable and will speed your recovery.  I have spent 5 minutes in review of e-visit questionnaire, review and updating patient chart, medical decision making and response to patient.   Solana Coggin M Dennie Vecchio, PA-C  

## 2022-10-16 ENCOUNTER — Other Ambulatory Visit: Payer: Self-pay | Admitting: Family Medicine

## 2022-11-20 ENCOUNTER — Other Ambulatory Visit: Payer: Self-pay

## 2022-11-20 DIAGNOSIS — F419 Anxiety disorder, unspecified: Secondary | ICD-10-CM

## 2022-11-20 MED ORDER — HYDROXYZINE HCL 25 MG PO TABS: ORAL_TABLET | ORAL | 0 refills | Status: DC

## 2022-11-28 ENCOUNTER — Other Ambulatory Visit: Payer: Self-pay | Admitting: Family Medicine

## 2022-11-28 DIAGNOSIS — K219 Gastro-esophageal reflux disease without esophagitis: Secondary | ICD-10-CM

## 2022-11-28 MED ORDER — PANTOPRAZOLE SODIUM 40 MG PO TBEC: DELAYED_RELEASE_TABLET | ORAL | 3 refills | Status: DC

## 2023-02-23 ENCOUNTER — Other Ambulatory Visit: Payer: Self-pay | Admitting: Family Medicine

## 2023-02-23 DIAGNOSIS — E119 Type 2 diabetes mellitus without complications: Secondary | ICD-10-CM

## 2023-02-23 MED ORDER — TRULICITY 1.5 MG/0.5ML ~~LOC~~ SOAJ: 1.5000 mg | SUBCUTANEOUS | 3 refills | Status: DC

## 2023-03-02 ENCOUNTER — Other Ambulatory Visit: Payer: Self-pay | Admitting: Family Medicine

## 2023-03-02 DIAGNOSIS — Z0000 Encounter for general adult medical examination without abnormal findings: Secondary | ICD-10-CM

## 2023-03-03 ENCOUNTER — Encounter: Payer: Self-pay | Admitting: Family Medicine

## 2023-03-03 ENCOUNTER — Ambulatory Visit: Payer: Medicaid Other | Admitting: Family Medicine

## 2023-03-03 VITALS — BP 128/70 | HR 85 | Ht 60.0 in | Wt 242.8 lb

## 2023-03-03 DIAGNOSIS — M545 Low back pain, unspecified: Secondary | ICD-10-CM | POA: Diagnosis not present

## 2023-03-03 DIAGNOSIS — E785 Hyperlipidemia, unspecified: Secondary | ICD-10-CM

## 2023-03-03 DIAGNOSIS — D509 Iron deficiency anemia, unspecified: Secondary | ICD-10-CM

## 2023-03-03 DIAGNOSIS — E1169 Type 2 diabetes mellitus with other specified complication: Secondary | ICD-10-CM | POA: Diagnosis not present

## 2023-03-03 DIAGNOSIS — E119 Type 2 diabetes mellitus without complications: Secondary | ICD-10-CM | POA: Diagnosis not present

## 2023-03-03 DIAGNOSIS — Z1159 Encounter for screening for other viral diseases: Secondary | ICD-10-CM

## 2023-03-03 DIAGNOSIS — F419 Anxiety disorder, unspecified: Secondary | ICD-10-CM | POA: Diagnosis not present

## 2023-03-03 DIAGNOSIS — Z1211 Encounter for screening for malignant neoplasm of colon: Secondary | ICD-10-CM | POA: Diagnosis not present

## 2023-03-03 LAB — POCT GLYCOSYLATED HEMOGLOBIN (HGB A1C): HbA1c, POC (prediabetic range): 5.7 % (ref 5.7–6.4)

## 2023-03-03 MED ORDER — HYDROXYZINE HCL 25 MG PO TABS: ORAL_TABLET | ORAL | 0 refills | Status: DC

## 2023-03-03 MED ORDER — ATORVASTATIN CALCIUM 40 MG PO TABS: 40.0000 mg | ORAL_TABLET | Freq: Every day | ORAL | 3 refills | Status: DC

## 2023-03-03 MED ORDER — TIZANIDINE HCL 4 MG PO TABS
4.0000 mg | ORAL_TABLET | Freq: Three times a day (TID) | ORAL | 0 refills | Status: DC | PRN
Start: 2023-03-03 — End: 2023-04-07

## 2023-03-03 NOTE — Progress Notes (Unsigned)
    SUBJECTIVE:   CHIEF COMPLAINT / HPI:   123456 Trulicity 1.5 mg weekly - starts in a couple weeks (still on 0.75 mg) Atorvastatin 40 mg  Lab Results  Component Value Date   HGBA1C 5.7 03/03/2023   HGBA1C 7.3 (A) 03/24/2022   HGBA1C 6.2 11/11/2021   Lab Results  Component Value Date   MICROALBUR 10 01/23/2021   [place] 87 11/11/2021   CREATININE 0.68 05/30/2021    HLD Not taking cholesterol meds - not transferred over to [place]   Lab Results  Component Value Date   CHOL 160 11/11/2021   HDL 43 11/11/2021   LDLCALC 87 11/11/2021   TRIG 176 (H) 11/11/2021   CHOLHDL 3.7 11/11/2021   Iron def - not taking iron because of constipation  PERTINENT  PMH / PSH: ***  OBJECTIVE:   BP 128/70   Pulse 85   Ht 5' (1.524 m)   Wt 242 lb 12.8 oz (110.1 kg)   LMP  (LMP Unknown)   SpO2 99%   BMI 47.42 kg/m   ***  ASSESSMENT/PLAN:   No problem-specific Assessment & Plan notes found for this encounter.     Ezequiel Essex, MD [place]

## 2023-03-03 NOTE — Patient Instructions (Signed)
It was wonderful to see you today. Thank you for allowing me to be a part of your care. Below is a short summary of what we discussed at your visit today:  Diabetes Your A1c was much better today! 5.7%. This is now in the pre-diabetic range.  Continue your Trulicity as planned.  Next A1c check in 6 months - October.   Cholesterol We will check again today.  Start back on your atorvastatin (Lipitor).  Come back for recheck in 1-2 months.   Colon cancer screening Go back to [facility] office to schedule your colonoscopy. This is to screen for colon cancer.    Please bring all of your medications to every appointment!  If you have any questions or concerns, please do not hesitate to contact us via phone or MyChart message.   Ezequiel Essex, MD

## 2023-03-04 ENCOUNTER — Ambulatory Visit: Payer: Medicaid Other

## 2023-03-04 DIAGNOSIS — Z1159 Encounter for screening for other viral diseases: Secondary | ICD-10-CM | POA: Insufficient documentation

## 2023-03-04 LAB — CBC
Hematocrit: 36.4 % (ref 34.0–46.6)
Hemoglobin: 12.1 g/dL (ref 11.1–15.9)
MCH: 31 pg (ref 26.6–33.0)
MCHC: 33.2 g/dL (ref 31.5–35.7)
MCV: 93 fL (ref 79–97)
Platelets: 287 10*3/uL (ref 150–450)
RBC: 3.9 x10E6/uL (ref 3.77–5.28)
RDW: 13.2 % (ref 11.7–15.4)
WBC: 6.7 10*3/uL (ref 3.4–10.8)

## 2023-03-04 LAB — LIPID PANEL
Chol/HDL Ratio: 5 ratio — ABNORMAL HIGH (ref 0.0–4.4)
Cholesterol, Total: 216 mg/dL — ABNORMAL HIGH (ref 100–199)
HDL: 43 mg/dL (ref >39)
LDL Chol Calc (NIH): 137 mg/dL — ABNORMAL HIGH (ref 0–99)
Triglycerides: 201 mg/dL — ABNORMAL HIGH (ref 0–149)
VLDL Cholesterol Cal: 36 mg/dL (ref 5–40)

## 2023-03-04 LAB — HEPATITIS C ANTIBODY: Hep C Virus Ab: NONREACTIVE

## 2023-03-04 NOTE — Assessment & Plan Note (Signed)
Obtain hepatitis C screening labs today with other blood work.

## 2023-03-04 NOTE — Assessment & Plan Note (Signed)
Not taking iron supplement at this time. - CBC today - If anemic, will add on iron studies - Iron supplement pending results of above

## 2023-03-04 NOTE — Assessment & Plan Note (Signed)
A1c improved to 5.7 today.  No adverse side effects to medications. - Continue Trulicity [id number] mg weekly, increase to 1.5 mg weekly as planned - Resume atorvastatin 40 mg - Next A1c 6 months - Patient politely declined urine microalbumin today, she does not believe she can provide sample - Referral to ophthalmology for routine diabetic eye exam

## 2023-03-04 NOTE — Assessment & Plan Note (Signed)
Previously referred to GI and completed the preoperative visit.  She has not yet scheduled the colonoscopy.  Encouraged her to call and schedule at her convenience.

## 2023-03-04 NOTE — Assessment & Plan Note (Signed)
Patient stopped taking atorvastatin inadvertently, as it was not transferred over from her old pharmacy to the new pharmacy. -Lipid panel today -Restart atorvastatin 40 mg - Direct LDL in 3 months

## 2023-03-06 ENCOUNTER — Ambulatory Visit
Admission: RE | Admit: 2023-03-06 | Discharge: 2023-03-06 | Disposition: A | Discharge status: Home / Self Care | Payer: Medicaid Other | Source: Ambulatory Visit | Attending: Family Medicine | Admitting: Family Medicine

## 2023-03-06 DIAGNOSIS — Z0000 Encounter for general adult medical examination without abnormal findings: Secondary | ICD-10-CM

## 2023-03-09 ENCOUNTER — Encounter: Payer: Self-pay | Admitting: Family Medicine

## 2023-03-12 ENCOUNTER — Ambulatory Visit: Payer: Medicaid Other | Admitting: Family Medicine

## 2023-03-12 NOTE — Progress Notes (Deleted)
  SUBJECTIVE:   CHIEF COMPLAINT / HPI:   ***    PERTINENT  PMH / PSH: ***  Past Medical History:  Diagnosis Date   Anxiety    Diabetes mellitus without complication    GERD (gastroesophageal reflux disease)     OBJECTIVE:  LMP  (LMP Unknown)   General: NAD, pleasant, able to participate in exam Cardiac: RRR, no murmurs auscultated Respiratory: CTAB, normal WOB Abdomen: soft, non-tender, non-distended, normoactive bowel sounds Extremities: warm and well perfused, no edema or cyanosis Skin: warm and dry, no rashes noted Neuro: alert, no obvious focal deficits, speech normal Psych: Normal affect and mood  ASSESSMENT/PLAN:   There are no diagnoses linked to this encounter. No orders of the defined types were placed in this encounter.  No follow-ups on file.  Vonna Drafts, MD [facility]

## 2023-04-02 ENCOUNTER — Encounter: Payer: Self-pay | Admitting: Family Medicine

## 2023-04-02 DIAGNOSIS — E1169 Type 2 diabetes mellitus with other specified complication: Secondary | ICD-10-CM

## 2023-04-02 DIAGNOSIS — F419 Anxiety disorder, unspecified: Secondary | ICD-10-CM

## 2023-04-02 MED ORDER — HYDROXYZINE HCL 50 MG PO TABS
50.0000 mg | ORAL_TABLET | Freq: Four times a day (QID) | ORAL | 2 refills | Status: AC | PRN
Start: 2023-04-02 — End: ?

## 2023-04-02 MED ORDER — HYDROXYZINE HCL 50 MG PO TABS
50.0000 mg | ORAL_TABLET | Freq: Four times a day (QID) | ORAL | 2 refills | Status: DC | PRN
Start: 2023-04-02 — End: 2023-04-02

## 2023-04-02 NOTE — Addendum Note (Signed)
Addended by: Valetta Close on: 04/02/2023 05:13 PM   Modules accepted: Orders

## 2023-04-07 ENCOUNTER — Other Ambulatory Visit: Payer: Self-pay | Admitting: Family Medicine

## 2023-04-07 DIAGNOSIS — M545 Low back pain, unspecified: Secondary | ICD-10-CM

## 2023-04-08 MED ORDER — TIZANIDINE HCL 4 MG PO TABS
4.0000 mg | ORAL_TABLET | Freq: Three times a day (TID) | ORAL | 0 refills | Status: DC | PRN
Start: 2023-04-08 — End: 2023-11-10

## 2023-04-18 ENCOUNTER — Other Ambulatory Visit: Payer: Self-pay | Admitting: Family Medicine

## 2023-04-18 DIAGNOSIS — E119 Type 2 diabetes mellitus without complications: Secondary | ICD-10-CM

## 2023-04-21 MED ORDER — TRULICITY 1.5 MG/0.5ML ~~LOC~~ SOAJ: 1.5000 mg | SUBCUTANEOUS | 3 refills | Status: DC

## 2023-04-23 IMAGING — MG MM DIGITAL SCREENING BILAT W/ TOMO AND CAD
6 of 12 series · 6 of 36 positions shown · non-contrast
Comparison: Previous exam(s).

CLINICAL DATA: Screening.

EXAM:
DIGITAL SCREENING BILATERAL MAMMOGRAM WITH TOMOSYNTHESIS AND CAD
TECHNIQUE: Bilateral screening digital craniocaudal and mediolateral oblique
mammograms were obtained. Bilateral screening digital breast
tomosynthesis was performed. The images were evaluated with
computer-aided detection.

[L MLO synth-2D]
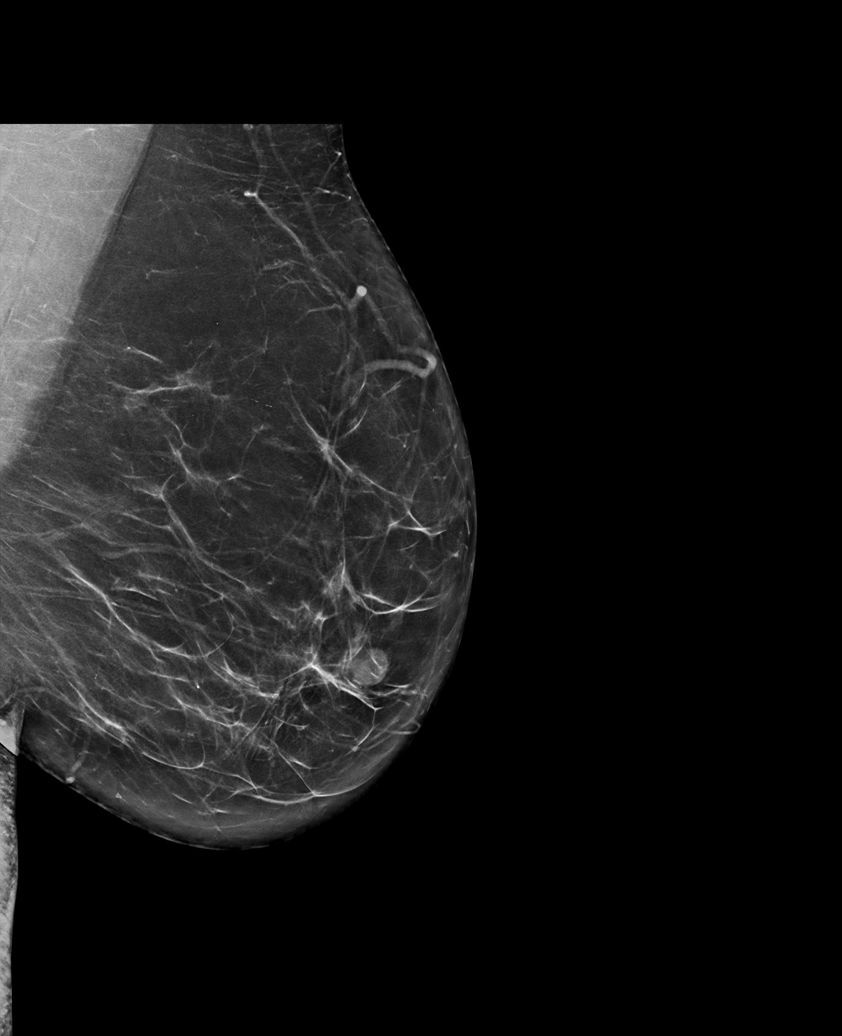

[R CC synth-2D]
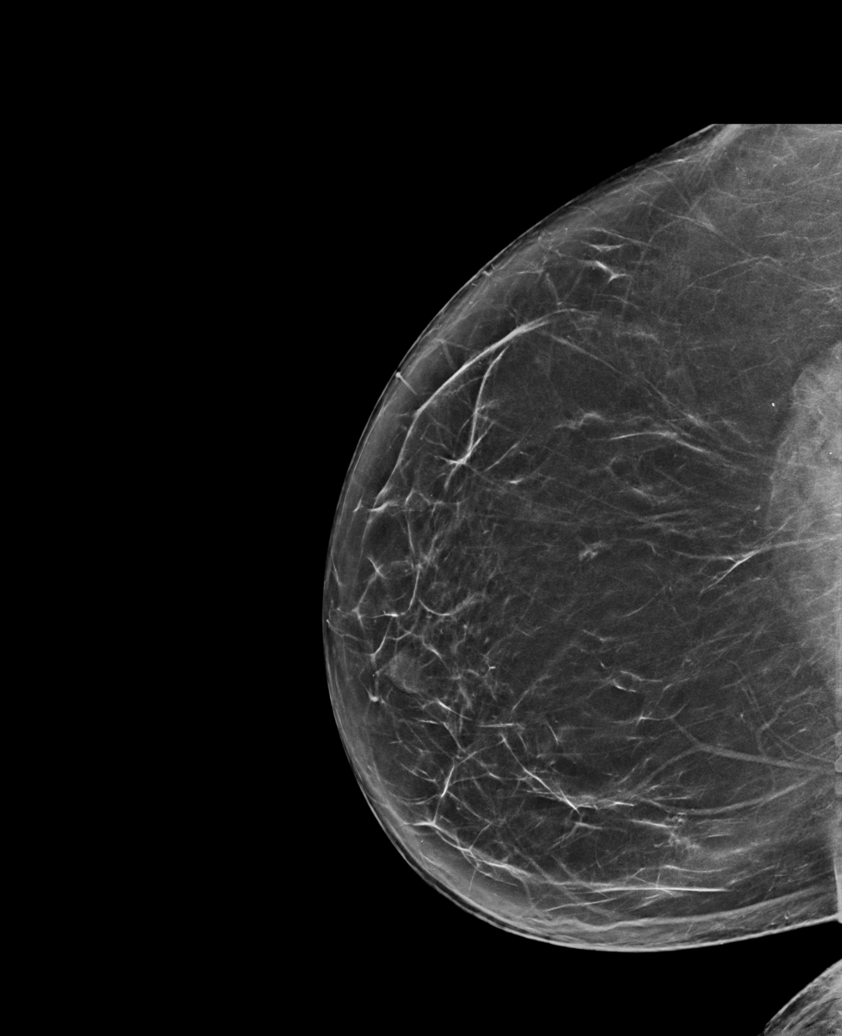

[L CC synth-2D (1 of 2)]
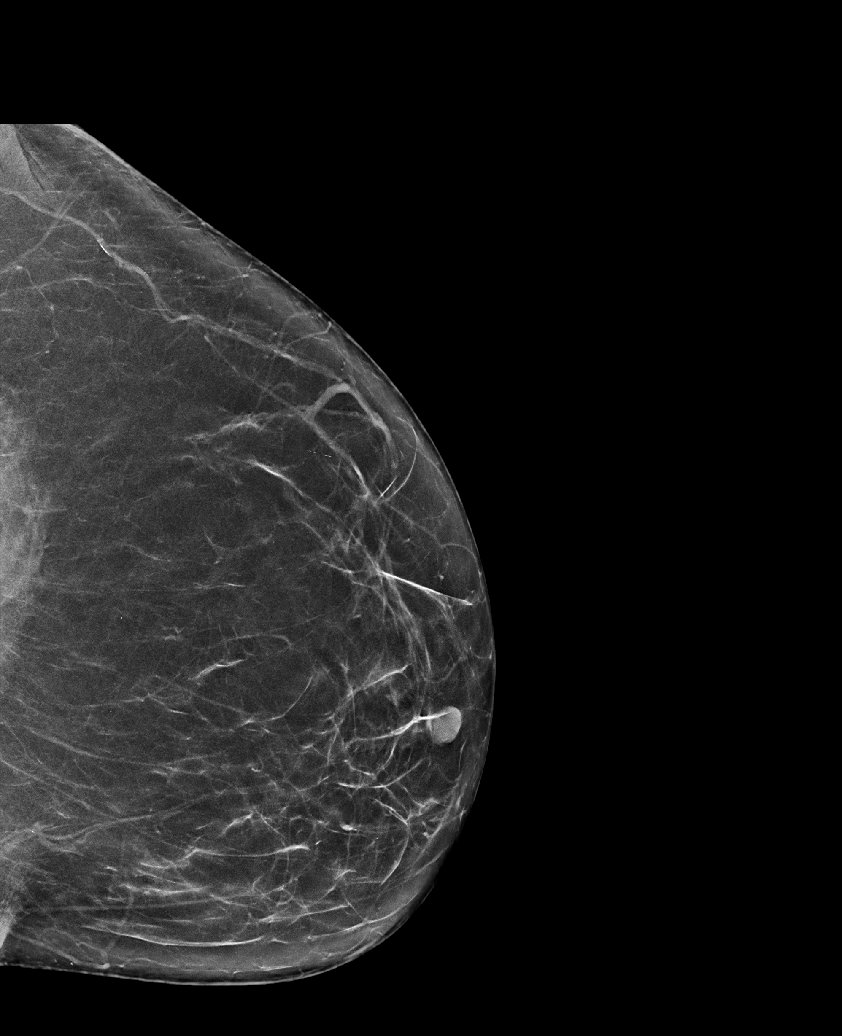

[R MLO synth-2D (1 of 2)]
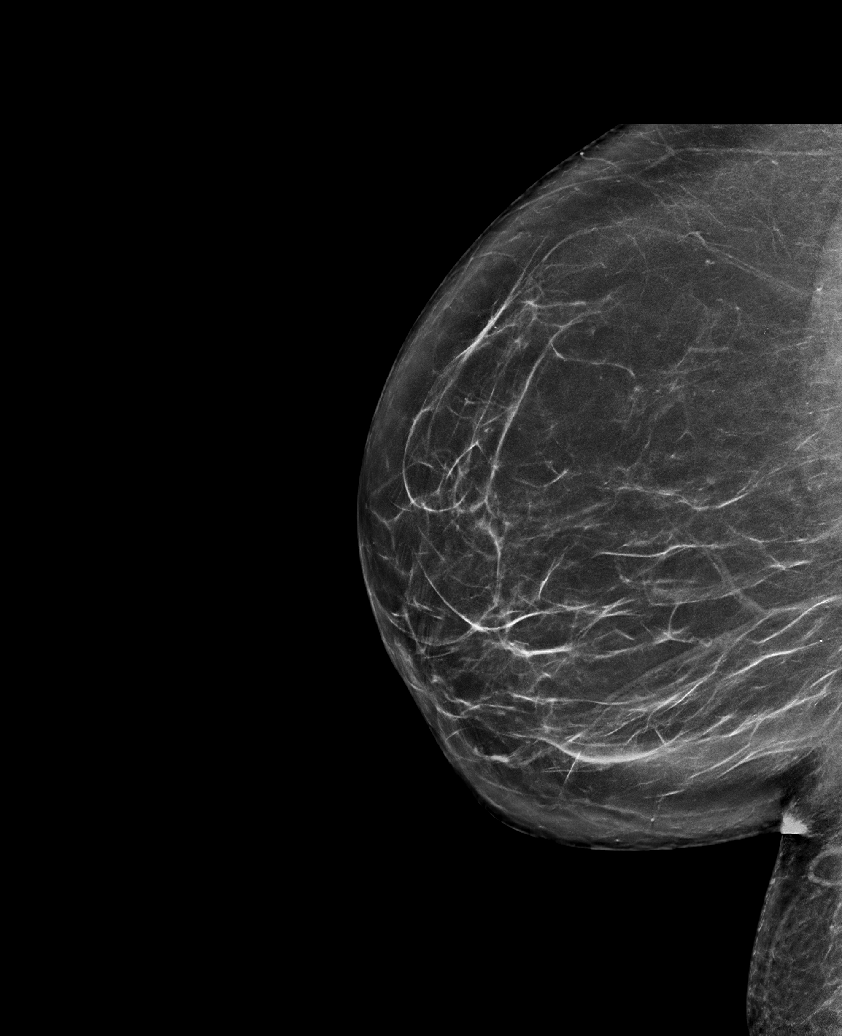

[L CC synth-2D (2 of 2)]
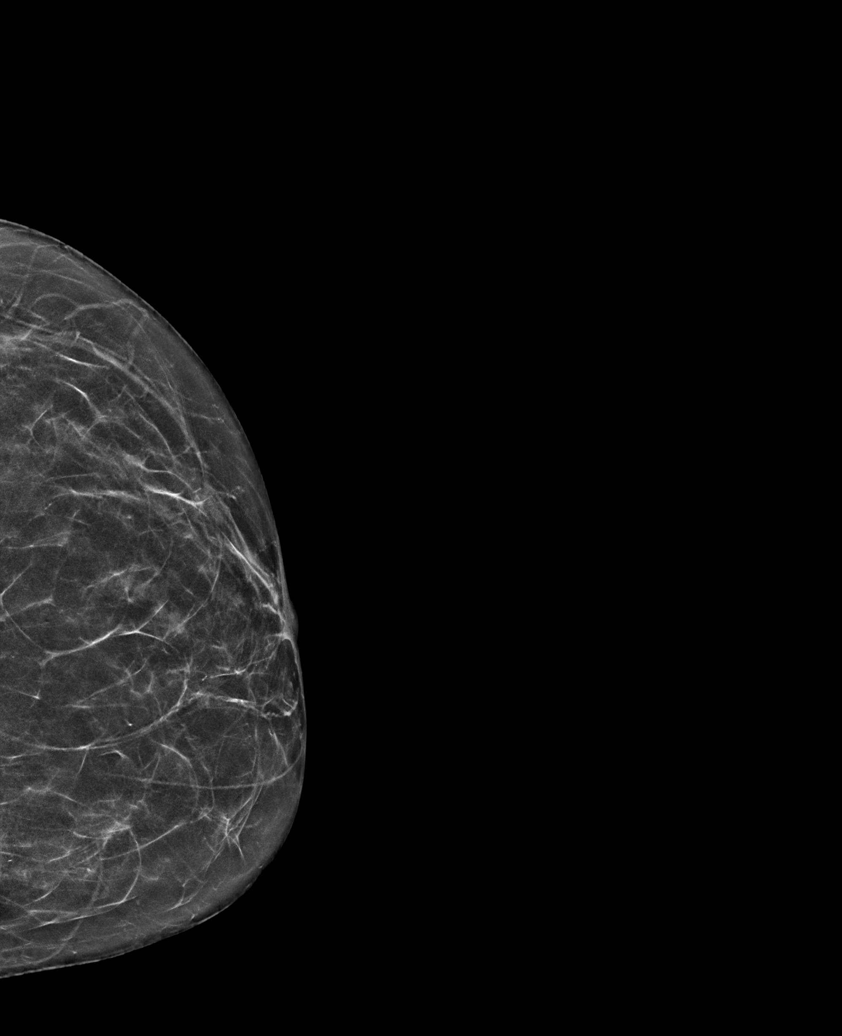

[R MLO synth-2D (2 of 2)]
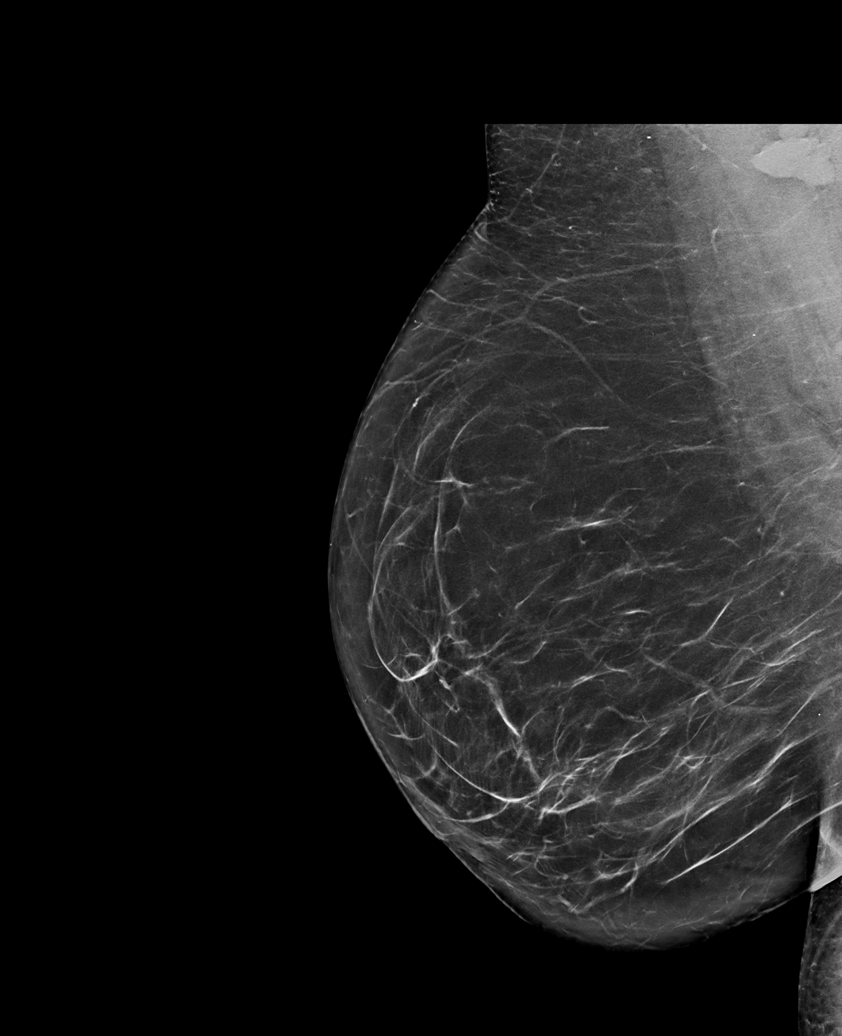

[6 of 36 positions shown; findings below may reference images not displayed]

ACR Breast Density Category b: There are scattered areas of
fibroglandular density.
FINDINGS: There are no findings suspicious for malignancy.
IMPRESSION: No mammographic evidence of malignancy. A result letter of this
screening mammogram will be mailed directly to the patient.

RECOMMENDATION:
Screening mammogram in one year. (Code:51-O-LD2)

BI-RADS CATEGORY  1: Negative.

## 2023-05-07 ENCOUNTER — Encounter: Payer: Self-pay | Admitting: Family Medicine

## 2023-09-02 ENCOUNTER — Ambulatory Visit: Payer: BC Managed Care – PPO | Admitting: Student

## 2023-09-02 ENCOUNTER — Encounter: Payer: Self-pay | Admitting: Family Medicine

## 2023-09-02 ENCOUNTER — Ambulatory Visit: Payer: BC Managed Care – PPO | Admitting: Family Medicine

## 2023-09-02 VITALS — BP 125/62 | HR 72 | Ht 62.0 in | Wt 243.1 lb

## 2023-09-02 DIAGNOSIS — F419 Anxiety disorder, unspecified: Secondary | ICD-10-CM

## 2023-09-02 DIAGNOSIS — D509 Iron deficiency anemia, unspecified: Secondary | ICD-10-CM | POA: Diagnosis not present

## 2023-09-02 DIAGNOSIS — Z7985 Long-term (current) use of injectable non-insulin antidiabetic drugs: Secondary | ICD-10-CM

## 2023-09-02 DIAGNOSIS — E785 Hyperlipidemia, unspecified: Secondary | ICD-10-CM

## 2023-09-02 DIAGNOSIS — E1169 Type 2 diabetes mellitus with other specified complication: Secondary | ICD-10-CM | POA: Diagnosis not present

## 2023-09-02 DIAGNOSIS — M545 Low back pain, unspecified: Secondary | ICD-10-CM

## 2023-09-02 DIAGNOSIS — E119 Type 2 diabetes mellitus without complications: Secondary | ICD-10-CM

## 2023-09-02 DIAGNOSIS — K219 Gastro-esophageal reflux disease without esophagitis: Secondary | ICD-10-CM

## 2023-09-02 LAB — POCT GLYCOSYLATED HEMOGLOBIN (HGB A1C): HbA1c, POC (controlled diabetic range): 5.7 % (ref 0.0–7.0)

## 2023-09-02 MED ORDER — FERROUS SULFATE 325 (65 FE) MG PO TABS
325.0000 mg | ORAL_TABLET | Freq: Every day | ORAL | 0 refills | Status: DC
Start: 2023-09-02 — End: 2024-04-15

## 2023-09-02 MED ORDER — PANTOPRAZOLE SODIUM 40 MG PO TBEC
DELAYED_RELEASE_TABLET | ORAL | 3 refills | Status: DC
Start: 2023-09-02 — End: 2024-01-14

## 2023-09-02 MED ORDER — HYDROXYZINE HCL 50 MG PO TABS: 50.0000 mg | ORAL_TABLET | Freq: Four times a day (QID) | ORAL | 2 refills | Status: DC | PRN

## 2023-09-02 NOTE — Patient Instructions (Addendum)
Thank you for visiting clinic today - it is always our pleasure to care for you.  Today we discussed your medications and anxiety symptoms.   Your A1c in clinic today was 5.7. If you experience symptoms of low blood sugar (dizziness, weakness, confusion) with your current Trulicity dose, please let us know and we will adjust accordingly.   For your anxiety symptoms, please reach out to the therapy resources provided below for Cognitive Behavioral Therapy. You may also contact your insurance provider to see which therapists are covered under your policy.   We will check your anemia labs at your next appointment in a month. In the meantime, please take the iron supplement every other day.   Please see your pain doctor for further medication management. We have sent a few doses of the tizanidine as needed in the meantime until you can see your pain doctor. We recommend to avoid taking multiple sedating medications at once.  Please schedule an appointment in 1 month for labs and to follow up on your symptoms.   Reach out any time with any questions or concerns you may have - we are here for you!  Jasmin Quale, MD [facility] [phone]   Therapy and Counseling Resources Most providers on this list will take Medicaid. Patients with commercial insurance or Medicare should contact their insurance company to get a list of in network providers.  [facility]:Psychiatry and Counseling [address] [place], [place] [address] [phone]  [facility]  [address] [place], [place] [address]      [phone]  [facility] [address]  [place], [place] [address] [phone]  [facility] [address]  [place], [place] [address]       [phone]     [facility] (virtual only) [phone]  [facility]ovita Kussmaul [facility] [address] [place], [place] [phone]  [facility]:  [address] [place] [phone]  [facility]:  [address] [place] [phone]  [facility] (under & uninsured) [address]   [place] [phone]    [email or web address] [address]  [place]    [phone]  [facility] -[address]     Phone:  [phone]     [facility]-  [address]  [phone]   [facility] [address]      [place], [place] [phone] ext [phone]  [facility]: [address] [place], [place], [place]  [phone]   [facility] (Spanish therapist) [email or web address]  [address]   [place] [address]    [phone]    [facility]   [address],  [place], [place]  [phone]   [facility]  [address] [place]  [phone]  [facility]  [address] [place], [place]        [phone]  Open Access/Walk In Clinic under & uninsured  [facility]  [address] [place], [place] [phone] Crisis [phone]  [facility] [address],  (Spanish)   [address], [place]: [phone]) 8:30 - 12; 1 - 2:30  [facility] the [employer],  [address] [place], [place]    [phone]):8:30 -  12; 2 - 3PM  [facility] [employer],  [address]  [place]; [phone]):   Mon - Fri 8 AM - 5 PM  [facility] [address] [place]  MWF 12:30 to 3:00 or call to schedule an appointment  [phone]  Specific Provider options Psychology Today  [email or web address] click on find a therapist  enter your zip code left side and select or tailor a therapist for your specific need.   [facility] Provider  Directory [email or web address]  (Medicaid)   Follow all drop down to find a provider  Social Support program [facility] [place] [phone] or PhotoSolver.pl [address], [place], [place] Recovery support and educational   24- Hour Availability:   [facility]  [address] [place], [place] [phone] Crisis [phone]  [facility] [phone]  [place] [facility]  [phone]   [facility]  [phone] (after hours)  Therapeutic Alternative/Mobile Crisis   [phone]  [place] National Suicide Hotline  2265916321 Len Childs)  Call 911 or go to emergency room  [facility]  [phone]);  [place] and [employer]  [phone]); [place], [place], [place], [place], [place], [place], [place]

## 2023-09-02 NOTE — Progress Notes (Unsigned)
    SUBJECTIVE:   CHIEF COMPLAINT / HPI:   Diabetes Last A1c of 5.7 in April 2024.  Has been on weekly 1.5 mg Trulicity injections.  She has appreciated the effects on her appetite.  She does not regularly check her blood sugar at home.  She has been to multiple medical appts in the last year and notes having good blood sugar reads at these, never over 200. No recent N/V/D, no HA/dizziness, no CP/SOB.  HLD Last lipid panel April 2024 with elevated total cholesterol, LDL, triglycerides.  Patient was started on atorvastatin 40 at that time.  Reports no issues with this medication, no new muscle pains.  Anxiety  Panic  Patient experiences anxiety/panic attacks every couple days of the week.  During these episodes, patient becomes tachycardic, SOB, jittery, and nervous.  Episodes last around 15 minutes.  She takes hydroxyzine as needed in which has been helpful.  She also notes her family as support.  She is interested in therapy.  PERTINENT  PMH / PSH: T2DM, HLD, Anxiety/panic disorder  OBJECTIVE:   BP 125/62   Pulse 72   Ht 5\' 2"  (1.575 m)   Wt 243 lb 2 oz (110.3 kg)   LMP  (LMP Unknown)   SpO2 99%   BMI 44.47 kg/m   General: Well-appearing. Resting comfortably in room. CV: Normal S1/S2. No extra heart sounds. Warm and well-perfused. Pulm: Breathing comfortably on room air. CTAB. No increased WOB. Abd: Soft, non-tender, non-distended. Skin:  Warm, dry. Psych: Pleasant and appropriate.   ASSESSMENT/PLAN:   Diabetes Stable A1c of 5.7 today. Last urine microalb/cr in April 2023. -Repeat urine microalb/cr today -Continue Trulicity as currently prescribed, 1.5 mg/week injection -Discussed signs of hypoglycemia and return precautions -Consider adding ACE/ARB at next visit   HLD HLD likely associated with diabetes.  -Continue atorvastatin 40 as previously prescribed -Annual lipid panel  Anxiety  Panic Patient with numerous panic attacks per week.  Has been taking hydroxyzine  as needed, is interested in therapy as well. -Continue hydroxyzine as needed -Discussed CBT and provided therapy resources in AVS  Patient to return to clinic in 1 month for follow up and anemia labs. Patient deferred anemia labs today, will continue taking her iron supplements every other day in the meantime.   Ivery Quale, MD [facility]

## 2023-09-03 ENCOUNTER — Ambulatory Visit: Payer: BC Managed Care – PPO | Admitting: Family Medicine

## 2023-09-08 ENCOUNTER — Encounter: Payer: Self-pay | Admitting: Family Medicine

## 2023-09-08 DIAGNOSIS — E119 Type 2 diabetes mellitus without complications: Secondary | ICD-10-CM

## 2023-09-09 MED ORDER — TRULICITY 1.5 MG/0.5ML ~~LOC~~ SOAJ
1.5000 mg | SUBCUTANEOUS | 3 refills | Status: DC
Start: 2023-09-09 — End: 2023-11-19

## 2023-09-11 ENCOUNTER — Telehealth: Payer: Self-pay

## 2023-09-11 NOTE — Telephone Encounter (Signed)
Pharmacy Patient Advocate Encounter   Received notification from CoverMyMeds that prior authorization for TRULICITY is required/requested.   PA required; PA submitted to [facility] via CoverMyMeds Key/confirmation #/[id number]. Status is pending

## 2023-09-14 NOTE — Telephone Encounter (Signed)
Pharmacy Patient Advocate Encounter  Received notification from [facility] that Prior Authorization for TRULICITY has been APPROVED from 1011/24 to 09/10/26   PA #/Case ID/Reference #:  [id number]

## 2023-09-24 ENCOUNTER — Telehealth: Payer: BC Managed Care – PPO | Admitting: Physician Assistant

## 2023-09-24 DIAGNOSIS — J208 Acute bronchitis due to other specified organisms: Secondary | ICD-10-CM

## 2023-09-24 MED ORDER — BENZONATATE 100 MG PO CAPS
100.0000 mg | ORAL_CAPSULE | Freq: Three times a day (TID) | ORAL | 0 refills | Status: DC | PRN
Start: 2023-09-24 — End: 2023-11-01

## 2023-09-24 MED ORDER — ALBUTEROL SULFATE HFA 108 (90 BASE) MCG/ACT IN AERS
2.0000 | INHALATION_SPRAY | Freq: Four times a day (QID) | RESPIRATORY_TRACT | 0 refills | Status: DC | PRN
Start: 2023-09-24 — End: 2024-08-11

## 2023-09-24 NOTE — Progress Notes (Signed)
Message sent to patient requesting further input regarding current symptoms. Awaiting patient response.  

## 2023-09-24 NOTE — Progress Notes (Signed)
E-Visit for Cough   We are sorry that you are not feeling well.  Here is how we plan to help!  Based on your presentation I believe you most likely have A cough due to a virus.  This is called viral bronchitis and is best treated by rest, plenty of fluids and control of the cough.  You may use Ibuprofen or Tylenol as directed to help your symptoms.     In addition you may use A prescription cough medication called Tessalon Perles 100mg . You may take 1-2 capsules every 8 hours as needed for your cough. I have also sent in a prescription albuterol inhaler to help relax your airway.   From your responses in the eVisit questionnaire you describe inflammation in the upper respiratory tract which is causing a significant cough.  This is commonly called Bronchitis and has four common causes:   Allergies Viral Infections Acid Reflux Bacterial Infection Allergies, viruses and acid reflux are treated by controlling symptoms or eliminating the cause. An example might be a cough caused by taking certain blood pressure medications. You stop the cough by changing the medication. Another example might be a cough caused by acid reflux. Controlling the reflux helps control the cough.  USE OF BRONCHODILATOR ("RESCUE") INHALERS: There is a risk from using your bronchodilator too frequently.  The risk is that over-reliance on a medication which only relaxes the muscles surrounding the breathing tubes can reduce the effectiveness of medications prescribed to reduce swelling and congestion of the tubes themselves.  Although you feel brief relief from the bronchodilator inhaler, your asthma may actually be worsening with the tubes becoming more swollen and filled with mucus.  This can delay other crucial treatments, such as oral steroid medications. If you need to use a bronchodilator inhaler daily, several times per day, you should discuss this with your provider.  There are probably better treatments that could be used  to keep your asthma under control.     HOME CARE Only take medications as instructed by your medical team. Complete the entire course of an antibiotic. Drink plenty of fluids and get plenty of rest. Avoid close contacts especially the very young and the elderly Cover your mouth if you cough or cough into your sleeve. Always remember to wash your hands A steam or ultrasonic humidifier can help congestion.   GET HELP RIGHT AWAY IF: You develop worsening fever. You become short of breath You cough up blood. Your symptoms persist after you have completed your treatment plan MAKE SURE YOU  Understand these instructions. Will watch your condition. Will get help right away if you are not doing well or get worse.    Thank you for choosing an e-visit.  Your e-visit answers were reviewed by a board certified advanced clinical practitioner to complete your personal care plan. Depending upon the condition, your plan could have included both over the counter or prescription medications.  Please review your pharmacy choice. Make sure the pharmacy is open so you can pick up prescription now. If there is a problem, you may contact your provider through [employer] and have the prescription routed to another pharmacy.  Your safety is important to [place]. If you have drug allergies check your prescription carefully.   For the next 24 hours you can use MyChart to ask questions about today's visit, request a non-urgent call back, or ask for a work or school excuse. You will get an email in the next two days asking about your  experience. I hope that your e-visit has been valuable and will speed your recovery.

## 2023-09-24 NOTE — Progress Notes (Signed)
I have spent 5 minutes in review of e-visit questionnaire, review and updating patient chart, medical decision making and response to patient.   Mia Milan Cody Jacklynn Dehaas, PA-C    

## 2023-09-29 NOTE — Progress Notes (Deleted)
    SUBJECTIVE:   CHIEF COMPLAINT / HPI: follow up  *** Microalbumin, foot exam  PERTINENT  PMH / PSH: anxiety, diabetes, GERD  OBJECTIVE:   LMP  (LMP Unknown)   General: Awake and Alert in NAD HEENT: Normocephalic, atraumatic. Conjunctiva normal. No nasal discharge Cardiovascular: RRR. No M/R/G Respiratory: CTAB, normal WOB on RA. No wheezing, crackles, rhonchi, or diminished breath sounds. Abdomen: Soft, non-tender, non-distended. Bowel sounds normoactive/hypoactive/hyperactive. *** Extremities: No BLE edema, no deformities or significant joint findings. Skin: Warm and dry. Neuro: A&Ox***. No focal neurological deficits.  ASSESSMENT/PLAN:   No problem-specific Assessment & Plan notes found for this encounter.     Fortunato Curling, DO [facility]

## 2023-09-30 ENCOUNTER — Ambulatory Visit: Payer: Self-pay | Admitting: Family Medicine

## 2023-10-26 ENCOUNTER — Other Ambulatory Visit (HOSPITAL_COMMUNITY): Payer: Self-pay

## 2023-10-26 MED ORDER — OXYCODONE-ACETAMINOPHEN 10-325 MG PO TABS
1.0000 | ORAL_TABLET | Freq: Four times a day (QID) | ORAL | 0 refills | Status: DC | PRN
Start: 2023-10-23 — End: 2024-10-06
  Filled 2023-10-26 (×2): qty 120, 30d supply, fill #0

## 2023-11-01 ENCOUNTER — Telehealth: Payer: BC Managed Care – PPO | Admitting: Family

## 2023-11-01 DIAGNOSIS — R202 Paresthesia of skin: Secondary | ICD-10-CM

## 2023-11-01 DIAGNOSIS — R200 Anesthesia of skin: Secondary | ICD-10-CM | POA: Diagnosis not present

## 2023-11-01 DIAGNOSIS — E119 Type 2 diabetes mellitus without complications: Secondary | ICD-10-CM | POA: Diagnosis not present

## 2023-11-01 MED ORDER — DICLOFENAC SODIUM 75 MG PO TBEC
75.0000 mg | DELAYED_RELEASE_TABLET | Freq: Two times a day (BID) | ORAL | 1 refills | Status: DC
Start: 2023-11-01 — End: 2023-11-10

## 2023-11-01 MED ORDER — PREDNISONE 20 MG PO TABS
40.0000 mg | ORAL_TABLET | Freq: Every day | ORAL | 0 refills | Status: AC
Start: 2023-11-01 — End: 2023-11-06

## 2023-11-01 MED ORDER — WRIST BRACE DELUXE/LEFT S/M MISC: 1.0000 | 0 refills | Status: DC

## 2023-11-01 NOTE — Progress Notes (Signed)
Virtual Visit Consent   Jasmin Collins, you are scheduled for a virtual visit with a [facility] provider today. Just as with appointments in the office, your consent must be obtained to participate. Your consent will be active for this visit and any virtual visit you may have with one of our providers in the next 365 days. If you have a MyChart account, a copy of this consent can be sent to you electronically.  As this is a virtual visit, video technology does not allow for your provider to perform a traditional examination. This may limit your provider's ability to fully assess your condition. If your provider identifies any concerns that need to be evaluated in person or the need to arrange testing (such as labs, EKG, etc.), we will make arrangements to do so. Although advances in technology are sophisticated, we cannot ensure that it will always work on either your end or our end. If the connection with a video visit is poor, the visit may have to be switched to a telephone visit. With either a video or telephone visit, we are not always able to ensure that we have a secure connection.  By engaging in this virtual visit, you consent to the provision of healthcare and authorize for your insurance to be billed (if applicable) for the services provided during this visit. Depending on your insurance coverage, you may receive a charge related to this service.  I need to obtain your verbal consent now. Are you willing to proceed with your visit today? Jasmin Collins has provided verbal consent on 11/01/2023 for a virtual visit (video or telephone). Jasmin Rodney, FNP  Date: 11/01/2023 1:22 PM  Virtual Visit via Video Note   I, Jasmin Collins, connected with  Jasmin Collins  ([id number], 19-Mar-1966) on 11/01/23 at  1:30 PM EST by a video-enabled telemedicine application and verified that I am speaking with the correct person using two identifiers.  Location: Patient: Virtual Visit Location  Patient: Other: car Provider: Virtual Visit Location Provider: Home Office   I discussed the limitations of evaluation and management by telemedicine and the availability of in person appointments. The patient expressed understanding and agreed to proceed.    History of Present Illness: Jasmin Collins is a 57 y.o. who identifies as a female who was assigned female at birth, and is being seen today for right hand tingling. Reports this started last week, but started yesterday more frequently. Denies any pain. States she is having numbness and tingling in her right hand. Denies any injury of her neck or back. She has not taken any medications. States she does work at [employer] and is right handed and has been scanning boxes and lifting.   She is a diabetic and her last A1C was 5.7.  She is on the way to Urgent Care.   HPI: HPI  Problems:  Patient Active Problem List   Diagnosis Date Noted   Need for hepatitis C screening test 03/04/2023   Alopecia 03/24/2022   Hyperlipidemia associated with type 2 diabetes mellitus (HCC) 11/12/2021   Screen for colon cancer 05/30/2021   Anemia 05/30/2021   Type 2 diabetes mellitus without complication, without long-term current use of insulin (HCC) 02/14/2021   Anxiety 02/14/2021   Acute bilateral low back pain without sciatica 10/05/2020   Gastroesophageal reflux disease without esophagitis 10/05/2020    Allergies: No Known Allergies Medications:  Current Outpatient Medications:    diclofenac (VOLTAREN) 75 MG EC tablet, Take 1 tablet (75  mg total) by mouth 2 (two) times daily., Disp: 60 tablet, Rfl: 1   Elastic Bandages & Supports (WRIST BRACE DELUXE/LEFT S/M) MISC, 1 Device by Does not apply route continuous., Disp: 1 each, Rfl: 0   predniSONE (DELTASONE) 20 MG tablet, Take 2 tablets (40 mg total) by mouth daily with breakfast for 5 days., Disp: 10 tablet, Rfl: 0   Accu-Chek Softclix Lancets lancets, Use as instructed, Disp: 100 each, Rfl: 12    albuterol (VENTOLIN HFA) 108 (90 Base) MCG/ACT inhaler, Inhale 2 puffs into the lungs every 6 (six) hours as needed for wheezing or shortness of breath., Disp: 8 g, Rfl: 0   atorvastatin (LIPITOR) 40 MG tablet, Take 1 tablet (40 mg total) by mouth daily., Disp: 90 tablet, Rfl: 3   Blood Glucose Monitoring Suppl (ACCU-CHEK GUIDE) w/Device KIT, 1 Device by Does not apply route daily., Disp: 1 kit, Rfl: 0   cetirizine (ZYRTEC) 10 MG tablet, Take 1 tablet (10 mg total) by mouth daily., Disp: 100 tablet, Rfl: 2   citalopram (CELEXA) 40 MG tablet, TAKE ONE TABLET BY MOUTH ONCE DAILY, Disp: 30 tablet, Rfl: 3   Dulaglutide (TRULICITY) 1.5 MG/0.5ML SOPN, Inject 1.5 mg into the skin once a week. Given that this is your only diabetes medicine, we increased from 0.75 mg to 1.5 mg to adequately control your blood sugar. Make sure to get A1c checks every 3-6 months., Disp: 6 mL, Rfl: 3   ferrous sulfate (FEROSUL) 325 (65 FE) MG tablet, Take 1 tablet (325 mg total) by mouth daily with breakfast., Disp: 20 tablet, Rfl: 0   fluticasone (FLONASE) 50 MCG/ACT nasal spray, Place 2 sprays into both nostrils daily., Disp: 16 g, Rfl: 6   glucose blood (ACCU-CHEK GUIDE) test strip, Use as instructed, Disp: 100 each, Rfl: 12   hydrOXYzine (ATARAX) 50 MG tablet, Take 1-2 tablets (50-100 mg total) by mouth every 6 (six) hours as needed for anxiety., Disp: 60 tablet, Rfl: 2   oxyCODONE-acetaminophen (PERCOCET) 10-325 MG tablet, Take 1 tablet by mouth 4 (four) times daily as needed., Disp: 120 tablet, Rfl: 0   pantoprazole (PROTONIX) 40 MG tablet, TAKE 1 TABLET(40 MG) BY MOUTH DAILY, Disp: 30 tablet, Rfl: 3   tiZANidine (ZANAFLEX) 4 MG tablet, Take 1 tablet (4 mg total) by mouth every 8 (eight) hours as needed for muscle spasms., Disp: 20 tablet, Rfl: 0  Observations/Objective: Patient is well-developed, well-nourished in no acute distress.  Resting comfortably in Head is normocephalic, atraumatic.  No labored breathing.   Speech is clear and coherent with logical content.  Patient is alert and oriented at baseline.  Full ROM of right arm, positive tinel   Assessment and Plan: 1. Numbness and tingling in right hand - diclofenac (VOLTAREN) 75 MG EC tablet; Take 1 tablet (75 mg total) by mouth 2 (two) times daily.  Dispense: 60 tablet; Refill: 1 - predniSONE (DELTASONE) 20 MG tablet; Take 2 tablets (40 mg total) by mouth daily with breakfast for 5 days.  Dispense: 10 tablet; Refill: 0 - Elastic Bandages & Supports (WRIST BRACE DELUXE/LEFT S/M) MISC; 1 Device by Does not apply route continuous.  Dispense: 1 each; Refill: 0  2. Type 2 diabetes mellitus without complication, without long-term current use of insulin (HCC)   Start diclofenac BID with food No other NSAID's  Wear wrist splint  Prednisone , low carb diet  Follow up if symptoms worsen or do not improve   Follow Up Instructions: I discussed the assessment and treatment plan  with the patient. The patient was provided an opportunity to ask questions and all were answered. The patient agreed with the plan and demonstrated an understanding of the instructions.  A copy of instructions were sent to the patient via MyChart unless otherwise noted below.    The patient was advised to call back or seek an in-person evaluation if the symptoms worsen or if the condition fails to improve as anticipated.    Jasmin Rodney, FNP

## 2023-11-01 NOTE — Patient Instructions (Signed)

## 2023-11-10 ENCOUNTER — Encounter: Payer: Self-pay | Admitting: Student

## 2023-11-10 ENCOUNTER — Ambulatory Visit (INDEPENDENT_AMBULATORY_CARE_PROVIDER_SITE_OTHER): Payer: BC Managed Care – PPO | Admitting: Student

## 2023-11-10 VITALS — BP 122/78 | HR 72 | Ht 62.0 in | Wt 247.2 lb

## 2023-11-10 DIAGNOSIS — R051 Acute cough: Secondary | ICD-10-CM

## 2023-11-10 DIAGNOSIS — G5601 Carpal tunnel syndrome, right upper limb: Secondary | ICD-10-CM

## 2023-11-10 DIAGNOSIS — E785 Hyperlipidemia, unspecified: Secondary | ICD-10-CM

## 2023-11-10 DIAGNOSIS — E119 Type 2 diabetes mellitus without complications: Secondary | ICD-10-CM | POA: Diagnosis not present

## 2023-11-10 DIAGNOSIS — E1169 Type 2 diabetes mellitus with other specified complication: Secondary | ICD-10-CM

## 2023-11-10 LAB — POCT GLYCOSYLATED HEMOGLOBIN (HGB A1C): HbA1c, POC (controlled diabetic range): 5.5 % (ref 0.0–7.0)

## 2023-11-10 MED ORDER — MELOXICAM 15 MG PO TABS: 15.0000 mg | ORAL_TABLET | Freq: Every day | ORAL | 0 refills | Status: DC

## 2023-11-10 MED ORDER — MELOXICAM 15 MG PO TABS: 15.0000 mg | ORAL_TABLET | Freq: Every day | ORAL | 0 refills | Status: AC

## 2023-11-10 NOTE — Progress Notes (Addendum)
    SUBJECTIVE:   CHIEF COMPLAINT / HPI:   Jasmin Collins is a 57 year old with PMHx of DM and GERD who is presenting to the office for a two week history of tinging and numbness in her hands.   Tingling & Numbness: For the past two weeks, both hands have been tingling mainly at fingertips and ventral side of palm. R >>L. 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 She denies any aggravating factors and states that the tingling can also occur intermittently during the day. She just started wearing a wrist brace after televisit on 12/1. Of note, her hands are not numb during or after work.   Cough This is a new problem. Episode onset: two days. The problem has been gradually worsening. The problem occurs constantly. The cough is Non-productive. Associated symptoms include nasal congestion, postnasal drip and rhinorrhea. Pertinent negatives include no chest pain, fever, headaches, sore throat or shortness of breath. The symptoms are aggravated by lying down. She has tried nothing for the symptoms. There is no history of asthma or COPD.    DM: Patient reports compliance with trulicity 1.5 mg injection once a week. She is here to check her A1C level for routine maintenance.   PERTINENT  PMH / PSH: DM, GERD  OBJECTIVE:   BP 122/78   Pulse 72   Ht 5\' 2"  (1.575 m)   Wt 247 lb 4 oz (112.2 kg)   LMP  (LMP Unknown)   BMI 45.22 kg/m   Physical Exam Constitutional:      Appearance: Normal appearance.  Cardiovascular:     Rate and Rhythm: Normal rate and regular rhythm.     Pulses: Normal pulses.     Heart sounds: Normal heart sounds.  Pulmonary:     Effort: Pulmonary effort is normal.     Breath sounds: Normal breath sounds.  Abdominal:     Palpations: Abdomen is soft.  Musculoskeletal:     Comments: Negative tinel sign. 5/5 strength in hands bilaterally without reproducible tenderness.  Neurological:     Mental Status: She is alert.     Additional  findings by resident: Throat: Postnasal drip present Ear: Right ear has impacted cerumen MSK: Negative Phalen  ASSESSMENT/PLAN:   Assessment & Plan Type 2 diabetes mellitus without complication, without long-term current use of insulin (HCC) A1c within normal limits today. - Continue Trulicity 1.5 mg weekly Carpal tunnel syndrome of right wrist Suspect carpal tunnel syndrome, given symptoms.  Tinel/Phalen's negative.  No thenar atrophy.  Given acuity and mild symptoms, will attempt bracing with short course of meloxicam.  Differential includes peripheral neuropathy, wrist arthritis. - Meloxicam 50 mg daily for 7 days - Wrist bracing at night - Follow-up in 6-8 weeks.  If symptoms worsen or do not improve, would consider oral gabapentin versus steroid injection. Acute cough Suspect viral URI.  Differential includes allergic rhinitis.  Impacted cerumen of right ear may be causing additional irritation, will irrigate today. - Supportive care measures  Oscar La, Medical Student [name]   I was personally present and performed or re-performed the history, physical exam and medical decision making activities of this service and have verified that the service and findings are accurately documented in the student's note.  Jasmin Kocher, DO                  11/10/2023, 2:12 PM

## 2023-11-10 NOTE — Patient Instructions (Signed)
It was great to see you! Thank you for allowing me to participate in your care!   I recommend that you always bring your medications to each appointment as this makes it easy to ensure we are on the correct medications and helps [place] not miss when refills are needed.  Our plans for today:  -Please take 50 mg of meloxicam daily, for 7 days -Please wear your wrist brace at night when you sleep -I recommend follow-up in 6 to 8 weeks   Take care and seek immediate care sooner if you develop any concerns. Please remember to show up 15 minutes before your scheduled appointment time!  Tiffany Kocher, DO [facility]

## 2023-11-10 NOTE — Assessment & Plan Note (Signed)
A1c within normal limits today. - Continue Trulicity 1.5 mg weekly

## 2023-11-12 ENCOUNTER — Ambulatory Visit: Payer: BC Managed Care – PPO | Admitting: Family Medicine

## 2023-11-17 ENCOUNTER — Encounter: Payer: Self-pay | Admitting: Family Medicine

## 2023-11-18 NOTE — Progress Notes (Signed)
    SUBJECTIVE:   CHIEF COMPLAINT / HPI: R hand numbness and tingling  Numbness/Tingling Presented a week ago with the same complaint. CTS expected at the time given acuity and mild symptoms, so wrist bracing at night with short course of Meloxicam was attempted. She reports that her symptoms still persist at night around 1/2AM (when she wakes up for work) with bracing and has been more noticeable today throughout the day.   PERTINENT  PMH / PSH: DM, GERD   OBJECTIVE:   BP 127/66   Pulse 85   Ht 5\' 2"  (1.575 m)   Wt 250 lb 12.8 oz (113.8 kg)   LMP  (LMP Unknown)   SpO2 100%   BMI 45.87 kg/m   General: Awake and Alert in NAD HEENT: NCAT. Sclera anicteric. No rhinorrhea.  Cardiovascular: RRR. No M/R/G Respiratory: CTAB, normal WOB on RA. No wheezing, crackles, rhonchi, or diminished breath sounds. Abdomen: Soft, non-tender, non-distended. Bowel sounds normoactive Extremities: + Phalen and Tinel sign on R. No BLE edema, no deformities or significant joint findings. Skin: Warm and dry. No abrasions or rashes noted. Neuro: A&Ox3. No focal neurological deficits.  ASSESSMENT/PLAN:   Assessment & Plan Carpal tunnel syndrome of right wrist History, symptoms, and positive Phalen and Tinel sign on the R today point to CTS.  She has completed her course of Meloxicam.  Discussed treatment options with patient including splinting, NSAIDs, glucocorticoid injection, NCS/surgical decompression.  We also discussed that weight loss could also improve her symptoms. - Advised patient to continue bracing at night and using a pillow between her wrists and using NSAIDs as needed with caution - Next step would be to consider steroid injection vs PT vs NCS/surgical decompression. Referral to orthopedic surgery placed today Type 2 diabetes mellitus without complication, without long-term current use of insulin (HCC) Increased Trulicity to 3 mg/0.5 mL today from 1.5 mg to help aid in additional weight loss  since it has been doing great to moderate her diabetes.   Fortunato Curling, DO [place] [facility]

## 2023-11-19 ENCOUNTER — Ambulatory Visit (INDEPENDENT_AMBULATORY_CARE_PROVIDER_SITE_OTHER): Payer: BC Managed Care – PPO | Admitting: Family Medicine

## 2023-11-19 ENCOUNTER — Encounter: Payer: Self-pay | Admitting: Family Medicine

## 2023-11-19 VITALS — BP 127/66 | HR 85 | Ht 62.0 in | Wt 250.8 lb

## 2023-11-19 DIAGNOSIS — E119 Type 2 diabetes mellitus without complications: Secondary | ICD-10-CM

## 2023-11-19 DIAGNOSIS — G5601 Carpal tunnel syndrome, right upper limb: Secondary | ICD-10-CM | POA: Diagnosis not present

## 2023-11-19 MED ORDER — TRULICITY 3 MG/0.5ML ~~LOC~~ SOAJ: 3.0000 mg | SUBCUTANEOUS | 3 refills | Status: DC

## 2023-11-19 NOTE — Assessment & Plan Note (Signed)
Increased Trulicity to 3 mg/0.5 mL today from 1.5 mg to help aid in additional weight loss since it has been doing great to moderate her diabetes.

## 2023-11-19 NOTE — Patient Instructions (Addendum)
It was great to see you today! Thank you for choosing [facility] for your primary care. Jasmin Collins was seen for carpal tunnel syndrome.  Today we addressed: Treatment options for carpal tunnel syndrome - non-steroidal anti-inflammatories vs steroid injection vs surgical decompression Weight loss can also help your symptoms - we will increase your Trulicity to 3.0 to see if that helps. Referral to orthopedic surgery placed for injection vs surgical options for carpal tunnel.  You should return to our clinic No follow-ups on file. Please arrive 15 minutes before your appointment to ensure smooth check in process.  We appreciate your efforts in making this happen.  Thank you for allowing me to participate in your care, Fortunato Curling, DO 11/19/2023, 2:37 PM PGY-1, [facility]

## 2023-11-24 ENCOUNTER — Other Ambulatory Visit (HOSPITAL_COMMUNITY): Payer: Self-pay

## 2023-11-24 MED ORDER — OXYCODONE-ACETAMINOPHEN 10-325 MG PO TABS
1.0000 | ORAL_TABLET | Freq: Four times a day (QID) | ORAL | 0 refills | Status: DC
  Filled 2023-11-24 – 2023-11-26 (×2): qty 120, 30d supply, fill #0

## 2023-11-26 ENCOUNTER — Other Ambulatory Visit (HOSPITAL_COMMUNITY): Payer: Self-pay

## 2023-12-14 ENCOUNTER — Other Ambulatory Visit: Payer: Self-pay

## 2023-12-14 DIAGNOSIS — J302 Other seasonal allergic rhinitis: Secondary | ICD-10-CM

## 2023-12-14 DIAGNOSIS — F419 Anxiety disorder, unspecified: Secondary | ICD-10-CM

## 2023-12-14 MED ORDER — CITALOPRAM HYDROBROMIDE 40 MG PO TABS: 40.0000 mg | ORAL_TABLET | Freq: Every day | ORAL | 1 refills | Status: DC

## 2023-12-21 ENCOUNTER — Other Ambulatory Visit: Payer: Self-pay

## 2023-12-21 DIAGNOSIS — E1169 Type 2 diabetes mellitus with other specified complication: Secondary | ICD-10-CM

## 2023-12-21 MED ORDER — ATORVASTATIN CALCIUM 40 MG PO TABS: 40.0000 mg | ORAL_TABLET | Freq: Every day | ORAL | 3 refills | Status: AC

## 2023-12-25 ENCOUNTER — Other Ambulatory Visit (HOSPITAL_COMMUNITY): Payer: Self-pay

## 2023-12-25 MED ORDER — OXYCODONE-ACETAMINOPHEN 10-325 MG PO TABS
1.0000 | ORAL_TABLET | Freq: Four times a day (QID) | ORAL | 0 refills | Status: DC | PRN
  Filled 2023-12-25 – 2023-12-28 (×2): qty 120, 30d supply, fill #0

## 2023-12-28 ENCOUNTER — Other Ambulatory Visit (HOSPITAL_COMMUNITY): Payer: Self-pay

## 2023-12-28 ENCOUNTER — Other Ambulatory Visit: Payer: Self-pay

## 2024-01-14 ENCOUNTER — Other Ambulatory Visit: Payer: Self-pay | Admitting: Family Medicine

## 2024-01-14 DIAGNOSIS — K219 Gastro-esophageal reflux disease without esophagitis: Secondary | ICD-10-CM

## 2024-01-21 ENCOUNTER — Telehealth: Payer: BC Managed Care – PPO | Admitting: Physician Assistant

## 2024-01-21 ENCOUNTER — Encounter: Payer: Self-pay | Admitting: Family Medicine

## 2024-01-21 DIAGNOSIS — R6889 Other general symptoms and signs: Secondary | ICD-10-CM

## 2024-01-21 MED ORDER — OSELTAMIVIR PHOSPHATE 75 MG PO CAPS: 75.0000 mg | ORAL_CAPSULE | Freq: Two times a day (BID) | ORAL | 0 refills | Status: DC

## 2024-01-21 NOTE — Progress Notes (Signed)
 I have spent 5 minutes in review of e-visit questionnaire, review and updating patient chart, medical decision making and response to patient.   Piedad Climes, PA-C

## 2024-01-21 NOTE — Progress Notes (Signed)

## 2024-01-26 ENCOUNTER — Other Ambulatory Visit: Payer: Self-pay

## 2024-01-26 ENCOUNTER — Other Ambulatory Visit (HOSPITAL_COMMUNITY): Payer: Self-pay

## 2024-01-26 MED ORDER — OXYCODONE-ACETAMINOPHEN 10-325 MG PO TABS
1.0000 | ORAL_TABLET | Freq: Four times a day (QID) | ORAL | 0 refills | Status: DC | PRN
  Filled 2024-01-26: qty 120, 30d supply, fill #0

## 2024-01-27 ENCOUNTER — Other Ambulatory Visit (HOSPITAL_COMMUNITY): Payer: Self-pay

## 2024-02-22 ENCOUNTER — Encounter: Payer: Self-pay | Admitting: Family Medicine

## 2024-02-23 ENCOUNTER — Ambulatory Visit

## 2024-02-23 NOTE — Telephone Encounter (Signed)
 Unfortunately from a Digital health standpoint -- we did not write her out of work at time of visit as work note was declined. We typically provide up to 2-day absence when providing a work note to ensure if ongoing symptoms they seek re-evaluation either with [place] or in person to make sure nothing is being missed, giving limitation of video visit assessments. Certainly no reason to write her out for that amount of time, especially without her having any re-evaluation in that 12 day period or notifying us or PCP of absence during that time period.  Would need to follow-up with PCP.

## 2024-02-25 ENCOUNTER — Other Ambulatory Visit: Payer: Self-pay

## 2024-02-25 ENCOUNTER — Other Ambulatory Visit (HOSPITAL_COMMUNITY): Payer: Self-pay

## 2024-02-25 MED ORDER — NALOXONE HCL 4 MG/0.1ML NA LIQD
1.0000 | NASAL | 12 refills | Status: DC | PRN
  Filled 2024-02-25: qty 2, 2d supply, fill #0

## 2024-02-25 MED ORDER — OXYCODONE-ACETAMINOPHEN 10-325 MG PO TABS
1.0000 | ORAL_TABLET | Freq: Four times a day (QID) | ORAL | 0 refills | Status: DC | PRN
  Filled 2024-02-25: qty 120, 30d supply, fill #0

## 2024-03-28 ENCOUNTER — Other Ambulatory Visit (HOSPITAL_COMMUNITY): Payer: Self-pay

## 2024-03-28 ENCOUNTER — Other Ambulatory Visit: Payer: Self-pay

## 2024-03-28 MED ORDER — OXYCODONE-ACETAMINOPHEN 10-325 MG PO TABS
1.0000 | ORAL_TABLET | Freq: Four times a day (QID) | ORAL | 0 refills | Status: DC | PRN
  Filled 2024-03-28 (×2): qty 120, 30d supply, fill #0

## 2024-04-01 ENCOUNTER — Telehealth

## 2024-04-01 DIAGNOSIS — A084 Viral intestinal infection, unspecified: Secondary | ICD-10-CM

## 2024-04-02 ENCOUNTER — Encounter: Payer: Self-pay | Admitting: Nurse Practitioner

## 2024-04-02 MED ORDER — ONDANSETRON HCL 4 MG PO TABS: 4.0000 mg | ORAL_TABLET | Freq: Three times a day (TID) | ORAL | 0 refills | Status: DC | PRN

## 2024-04-02 NOTE — Progress Notes (Signed)
 E-Visit for Diarrhea  We are sorry that you are not feeling well.  Here is how we plan to help!  Based on what you have shared with me it looks like you have Acute Infectious Diarrhea.  Most cases of acute diarrhea are due to infections with virus and bacteria and are self-limited conditions lasting less than 14 days.  For your symptoms you may take Imodium 2 mg tablets that are over the counter at your local pharmacy. Take two tablet now and then one after each loose stool up to 6 a day.  Antibiotics are not needed for most people with diarrhea.  For Nausea I have prescribed: Zofran 4 mg 1 tablet every 8 hours as needed for nausea and vomiting I have placed a work note in Pharmacologist as well if needed.     HOME CARE We recommend changing your diet to help with your symptoms for the next few days. Drink plenty of fluids that contain water salt and sugar. Sports drinks such as Gatorade may help.  You may try broths, soups, bananas, applesauce, soft breads, mashed potatoes or crackers.  You are considered infectious for as long as the diarrhea continues. Hand washing or use of alcohol based hand sanitizers is recommend. It is best to stay out of work or school until your symptoms stop.   GET HELP RIGHT AWAY If you have dark yellow colored urine or do not pass urine frequently you should drink more fluids.   If your symptoms worsen  If you feel like you are going to pass out (faint) You have a new problem  MAKE SURE YOU  Understand these instructions. Will watch your condition. Will get help right away if you are not doing well or get worse.  Thank you for choosing an e-visit.  Your e-visit answers were reviewed by a board certified advanced clinical practitioner to complete your personal care plan. Depending upon the condition, your plan could have included both over the counter or prescription medications.  Please review your pharmacy choice. Make sure the pharmacy is open so  you can pick up prescription now. If there is a problem, you may contact your provider through [employer] and have the prescription routed to another pharmacy.  Your safety is important to us . If you have drug allergies check your prescription carefully.   For the next 24 hours you can use MyChart to ask questions about today's visit, request a non-urgent call back, or ask for a work or school excuse. You will get an email in the next two days asking about your experience. I hope that your e-visit has been valuable and will speed your recovery.

## 2024-04-12 ENCOUNTER — Other Ambulatory Visit: Payer: Self-pay | Admitting: Family Medicine

## 2024-04-12 ENCOUNTER — Encounter: Payer: Self-pay | Admitting: Family Medicine

## 2024-04-12 ENCOUNTER — Other Ambulatory Visit: Payer: Self-pay | Admitting: Student

## 2024-04-12 DIAGNOSIS — E119 Type 2 diabetes mellitus without complications: Secondary | ICD-10-CM

## 2024-04-12 DIAGNOSIS — K219 Gastro-esophageal reflux disease without esophagitis: Secondary | ICD-10-CM

## 2024-04-12 DIAGNOSIS — F419 Anxiety disorder, unspecified: Secondary | ICD-10-CM

## 2024-04-12 DIAGNOSIS — E785 Hyperlipidemia, unspecified: Secondary | ICD-10-CM

## 2024-04-14 ENCOUNTER — Encounter: Payer: Self-pay | Admitting: Family Medicine

## 2024-04-14 ENCOUNTER — Other Ambulatory Visit: Payer: Self-pay | Admitting: Family Medicine

## 2024-04-14 DIAGNOSIS — E1169 Type 2 diabetes mellitus with other specified complication: Secondary | ICD-10-CM

## 2024-04-14 DIAGNOSIS — F419 Anxiety disorder, unspecified: Secondary | ICD-10-CM

## 2024-04-14 NOTE — Progress Notes (Signed)
 I have spent 5 minutes in review of e-visit questionnaire, review and updating patient chart, medical decision making and response to patient.   Claiborne Rigg, NP

## 2024-04-15 ENCOUNTER — Ambulatory Visit: Admitting: Family Medicine

## 2024-04-15 VITALS — BP 130/70 | HR 81 | Temp 98.8°F | Wt 239.6 lb

## 2024-04-15 DIAGNOSIS — G5601 Carpal tunnel syndrome, right upper limb: Secondary | ICD-10-CM | POA: Diagnosis not present

## 2024-04-15 NOTE — Patient Instructions (Signed)
 It was great to see you today! Thank you for choosing [facility] for your primary care. Jasmin Collins was seen for R hand numbness and burning.  Today we addressed: Carpal Tunnel Syndrome - call and set up appointment to discuss steroid injection vs nerve conduction studies vs surgery Referral sent to: [facility] [address] ([address] 2) [place], [place] [address] Phone: [phone]  You should return to our clinic No follow-ups on file. Please arrive 15 minutes before your appointment to ensure smooth check in process.  We appreciate your efforts in making this happen.  Thank you for allowing me to participate in your care, Clyda Dark, DO 04/15/2024, 4:03 PM PGY-1, [facility]

## 2024-04-15 NOTE — Progress Notes (Signed)
   SUBJECTIVE:   CHIEF COMPLAINT / HPI:   Hand numbness/tingling/burning - Difficulty sleeping due to burning and tingling last night - Difficulty opening bottles - Wearing a brace, the hand discomfort started back up this month, last time it was bothering her was December - Using pillow between her wrists with sleeping - Tried Meloxicam  previously  PERTINENT  PMH / PSH: anxiety, T2DM, GERD  OBJECTIVE:   BP 130/70   Pulse 81   Temp 98.8 F (37.1 C)   Wt 239 lb 10.2 oz (108.7 kg)   LMP  (LMP Unknown)   SpO2 94%   BMI 43.83 kg/m   General: Awake and Alert in NAD HEENT: NCAT. Sclera anicteric. No rhinorrhea. Cardiovascular: RRR. No M/R/G Respiratory: CTAB, normal WOB on RA. No wheezing, crackles, rhonchi, or diminished breath sounds. Abdomen: Soft, non-tender, non-distended. Bowel sounds normoactive Extremities: Able to move all extremities. No BLE edema, no deformities or significant joint findings. +Tinels and Phalens of R wrist Skin: Warm and dry. No abrasions or rashes noted. Neuro: A&Ox3. No focal neurological deficits.  ASSESSMENT/PLAN:   Assessment & Plan Carpal tunnel syndrome of right wrist History, symptoms, and positive Phalen and Tinel sign on the R today point to CTS.  Discussed treatment options with patient including splinting, NSAIDs, glucocorticoid injection, NCS/surgical decompression.  We also discussed that weight loss could also improve her symptoms. - Referral to orthopedic surgery placed at her last visit, provided info in AVS - Next step would be to consider steroid injection vs NCS/surgical decompression.   Clyda Dark, DO [facility]

## 2024-04-22 ENCOUNTER — Other Ambulatory Visit (HOSPITAL_BASED_OUTPATIENT_CLINIC_OR_DEPARTMENT_OTHER): Payer: Self-pay

## 2024-04-22 ENCOUNTER — Other Ambulatory Visit (HOSPITAL_COMMUNITY): Payer: Self-pay

## 2024-04-22 MED ORDER — BELBUCA 600 MCG BU FILM
1.0000 | ORAL_FILM | Freq: Two times a day (BID) | BUCCAL | 0 refills | Status: DC
Start: 2024-04-22 — End: 2024-10-06
  Filled 2024-04-22 – 2024-04-27 (×2): qty 60, 30d supply, fill #0

## 2024-04-22 MED ORDER — OXYCODONE-ACETAMINOPHEN 10-325 MG PO TABS
1.0000 | ORAL_TABLET | Freq: Four times a day (QID) | ORAL | 0 refills | Status: DC | PRN
  Filled 2024-04-22 – 2024-04-27 (×3): qty 120, 30d supply, fill #0
  Filled ????-??-??: fill #0

## 2024-04-26 ENCOUNTER — Other Ambulatory Visit (HOSPITAL_COMMUNITY): Payer: Self-pay

## 2024-04-26 ENCOUNTER — Other Ambulatory Visit: Payer: Self-pay

## 2024-04-27 ENCOUNTER — Other Ambulatory Visit (HOSPITAL_COMMUNITY): Payer: Self-pay

## 2024-04-28 ENCOUNTER — Encounter: Payer: Self-pay | Admitting: Family Medicine

## 2024-04-28 ENCOUNTER — Ambulatory Visit: Admitting: Family Medicine

## 2024-04-28 VITALS — BP 110/68 | HR 85 | Ht 62.0 in | Wt 240.2 lb

## 2024-04-28 DIAGNOSIS — Z0000 Encounter for general adult medical examination without abnormal findings: Secondary | ICD-10-CM

## 2024-04-28 DIAGNOSIS — F419 Anxiety disorder, unspecified: Secondary | ICD-10-CM

## 2024-04-28 NOTE — Patient Instructions (Addendum)
 It was great to see you today! Thank you for choosing [facility] for your primary care. Jasmin Collins was seen for anxiety.  Today we addressed: Anxiety: Continue Citalopram  daily and Atarax  as needed for acute attacks. Seems like the family stressors with your grandson is making your anxiety worse right now and I think therapy could be beneficial to help talk through things and find an outlet/mechanisms to control the anxiety  For information on therapists, please go to [email or web address]. You can also contact your insurance company to find an Hospital doctor.   Diabetes: Follow up in August to reassess diabetes   Vaccines: Nurse visit for Tdap and Prevnar vaccines  For information on therapists, please go to [email or web address]. You can also contact your insurance company to find an Hospital doctor.   We are checking some labs today. I will send you a MyChart message with your results, per your preference. If you do not hear about your labs in the next 2 weeks, please call the office.  You should return to our clinic Return in about 3 months (around 07/29/2024) for diabetes follow up. Please arrive 15 minutes before your appointment to ensure smooth check in process.  We appreciate your efforts in making this happen.  Thank you for allowing me to participate in your care, Clyda Dark, DO 04/28/2024, 9:35 AM PGY-1, [facility]

## 2024-04-28 NOTE — Assessment & Plan Note (Signed)
 Increase in anxiety likely due to her grandson. Patient feels like the Citalopram  and the Atarax  help control her anxiety attacks when they occur. - Discussed trying a new SSRI vs therapy; patient would like to try therapy - advised patient to call her insurance to see if there are in-network therapist for her - Advised proper sleep hygiene, meditation, coping skills to help with anxiety attacks - Follow up as needed

## 2024-04-28 NOTE — Progress Notes (Signed)
   SUBJECTIVE:   CHIEF COMPLAINT / HPI:   Anxiety - Citalopram  40 mg daily - Atarax  50 mg prn - Mainly occurs at night and often back to back. She feels like she panic attack (heart racing) - No new environmental stressors, but her grandson (15) who she has full custody of has been causing her more stress. He is having difficulty with disobedience at school and runs away from home at times and returns upon his own will. - Difficulty sleeping, tossing and turning at night, and decreased appetite - Coping mechanisms: calls family, sitting next to someone, deep breathing - Denies any SI/HI  DM - A1c 5.4 on 04/22/24 at [facility] - CMP wnl from [facility] reviewed at visit - Can't urinate at this time for urine ACR  Cologuard ordered by [facility] as well.  PERTINENT  PMH / PSH: T2DM, GERD  OBJECTIVE:   BP 110/68   Pulse 85   Ht 5\' 2"  (1.575 m)   Wt 240 lb 3.2 oz (109 kg)   LMP  (LMP Unknown)   SpO2 97%   BMI 43.93 kg/m   General: Awake and Alert in NAD HEENT: NCAT. Sclera anicteric. No rhinorrhea. Cardiovascular: RRR. No M/R/G Respiratory: CTAB, normal WOB on RA. No wheezing, crackles, rhonchi, or diminished breath sounds. Abdomen: Soft, non-tender, non-distended. Bowel sounds normoactive Extremities: Able to move all extremities. No BLE edema, no deformities or significant joint findings. Skin: Warm and dry. No abrasions or rashes noted. Neuro: No focal neurological deficits.  ASSESSMENT/PLAN:   Assessment & Plan Anxiety Increase in anxiety likely due to her grandson. Patient feels like the Citalopram  and the Atarax  help control her anxiety attacks when they occur. - Discussed trying a new SSRI vs therapy; patient would like to try therapy - advised patient to call her insurance to see if there are in-network therapist for her - Advised proper sleep hygiene, meditation, coping skills to help with anxiety attacks - Follow up as  needed Healthcare maintenance - Vaccines: due for Tdap, Prevnar, and Shingles; advised nurse visit for Tdap and Prevnar and pharmacy for Shingles - DM: recently had A1c and lab work done at [facility], reviewed at the visit and wnl, advised follow up with us  for diabetes in ~3 months.   Clyda Dark, DO [facility]

## 2024-05-12 ENCOUNTER — Telehealth: Admitting: Family Medicine

## 2024-05-12 DIAGNOSIS — J029 Acute pharyngitis, unspecified: Secondary | ICD-10-CM

## 2024-05-12 NOTE — Progress Notes (Signed)
E-Visit for Sore Throat  We are sorry that you are not feeling well.  Here is how we plan to help!  Your symptoms indicate a likely viral infection (Pharyngitis).   Pharyngitis is inflammation in the back of the throat which can cause a sore throat, scratchiness and sometimes difficulty swallowing.   Pharyngitis is typically caused by a respiratory virus and will just run its course.  Please keep in mind that your symptoms could last up to 10 days.  For throat pain, we recommend over the counter oral pain relief medications such as acetaminophen or aspirin, or anti-inflammatory medications such as ibuprofen or naproxen sodium.  Topical treatments such as oral throat lozenges or sprays may be used as needed.  Avoid close contact with loved ones, especially the very young and elderly.  Remember to wash your hands thoroughly throughout the day as this is the number one way to prevent the spread of infection and wipe down door knobs and counters with disinfectant.  After careful review of your answers, I would not recommend an antibiotic for your condition.  Antibiotics should not be used to treat conditions that we suspect are caused by viruses like the virus that causes the common cold or flu. However, some people can have Strep with atypical symptoms. You may need formal testing in clinic or office to confirm if your symptoms continue or worsen.  Providers prescribe antibiotics to treat infections caused by bacteria. Antibiotics are very powerful in treating bacterial infections when they are used properly.  To maintain their effectiveness, they should be used only when necessary.  Overuse of antibiotics has resulted in the development of super bugs that are resistant to treatment!    Home Care: Only take medications as instructed by your medical team. Do not drink alcohol while taking these medications. A steam or ultrasonic humidifier can help congestion.  You can place a towel over your head and  breathe in the steam from hot water coming from a faucet. Avoid close contacts especially the very young and the elderly. Cover your mouth when you cough or sneeze. Always remember to wash your hands.  Get Help Right Away If: You develop worsening fever or throat pain. You develop a severe head ache or visual changes. Your symptoms persist after you have completed your treatment plan.  Make sure you Understand these instructions. Will watch your condition. Will get help right away if you are not doing well or get worse.   Thank you for choosing an e-visit.  Your e-visit answers were reviewed by a board certified advanced clinical practitioner to complete your personal care plan. Depending upon the condition, your plan could have included both over the counter or prescription medications.  Please review your pharmacy choice. Make sure the pharmacy is open so you can pick up prescription now. If there is a problem, you may contact your provider through MyChart messaging and have the prescription routed to another pharmacy.  Your safety is important to us. If you have drug allergies check your prescription carefully.   For the next 24 hours you can use MyChart to ask questions about today's visit, request a non-urgent call back, or ask for a work or school excuse. You will get an email in the next two days asking about your experience. I hope that your e-visit has been valuable and will speed your recovery.   I provided 5 minutes of non face-to-face time during this encounter for chart review, medication and order placement, as   well as and documentation.   

## 2024-05-13 ENCOUNTER — Telehealth

## 2024-05-13 ENCOUNTER — Telehealth: Admitting: Physician Assistant

## 2024-05-13 ENCOUNTER — Emergency Department (HOSPITAL_COMMUNITY)
Admission: EM | Admit: 2024-05-13 | Discharge: 2024-05-13 | Disposition: A | Discharge status: Home / Self Care | Attending: Emergency Medicine | Admitting: Emergency Medicine

## 2024-05-13 ENCOUNTER — Other Ambulatory Visit: Payer: Self-pay

## 2024-05-13 ENCOUNTER — Encounter (HOSPITAL_COMMUNITY): Payer: Self-pay | Admitting: *Deleted

## 2024-05-13 ENCOUNTER — Emergency Department (HOSPITAL_COMMUNITY)

## 2024-05-13 DIAGNOSIS — J029 Acute pharyngitis, unspecified: Secondary | ICD-10-CM | POA: Diagnosis present

## 2024-05-13 DIAGNOSIS — E119 Type 2 diabetes mellitus without complications: Secondary | ICD-10-CM | POA: Insufficient documentation

## 2024-05-13 DIAGNOSIS — J020 Streptococcal pharyngitis: Secondary | ICD-10-CM | POA: Diagnosis not present

## 2024-05-13 LAB — CBC WITH DIFFERENTIAL/PLATELET
Abs Immature Granulocytes: 0.05 10*3/uL (ref 0.00–0.07)
Basophils Absolute: 0 10*3/uL (ref 0.0–0.1)
Basophils Relative: 0 %
Eosinophils Absolute: 0.1 10*3/uL (ref 0.0–0.5)
Eosinophils Relative: 1 %
HCT: 36.3 % (ref 36.0–46.0)
Hemoglobin: 11.4 g/dL — ABNORMAL LOW (ref 12.0–15.0)
Immature Granulocytes: 1 %
Lymphocytes Relative: 8 %
Lymphs Abs: 0.9 10*3/uL (ref 0.7–4.0)
MCH: 27.7 pg (ref 26.0–34.0)
MCHC: 31.4 g/dL (ref 30.0–36.0)
MCV: 88.3 fL (ref 80.0–100.0)
Monocytes Absolute: 0.5 10*3/uL (ref 0.1–1.0)
Monocytes Relative: 5 %
Neutro Abs: 8.8 10*3/uL — ABNORMAL HIGH (ref 1.7–7.7)
Neutrophils Relative %: 85 %
Platelets: 267 10*3/uL (ref 150–400)
RBC: 4.11 MIL/uL (ref 3.87–5.11)
RDW: 17.8 % — ABNORMAL HIGH (ref 11.5–15.5)
WBC: 10.3 10*3/uL (ref 4.0–10.5)
nRBC: 0 % (ref 0.0–0.2)

## 2024-05-13 LAB — BASIC METABOLIC PANEL WITH GFR
Anion gap: 10 (ref 5–15)
BUN: 10 mg/dL (ref 6–20)
CO2: 23 mmol/L (ref 22–32)
Calcium: 8.7 mg/dL — ABNORMAL LOW (ref 8.9–10.3)
Chloride: 105 mmol/L (ref 98–111)
Creatinine, Ser: 0.61 mg/dL (ref 0.44–1.00)
GFR, Estimated: >60 mL/min (ref >60)
Glucose, Bld: 106 mg/dL — ABNORMAL HIGH (ref 70–99)
Potassium: 3.7 mmol/L (ref 3.5–5.1)
Sodium: 138 mmol/L (ref 135–145)

## 2024-05-13 LAB — GROUP A STREP BY PCR: Group A Strep by PCR: DETECTED — AB

## 2024-05-13 MED ORDER — CLINDAMYCIN PHOSPHATE 600 MG/50ML IV SOLN
600.0000 mg | Freq: Once | INTRAVENOUS | Status: AC
  Filled 2024-05-13: qty 50

## 2024-05-13 MED ORDER — IOHEXOL 350 MG/ML SOLN: 75.0000 mL | Freq: Once | INTRAVENOUS | Status: AC | PRN

## 2024-05-13 MED ORDER — AMOXICILLIN 500 MG PO CAPS: 500.0000 mg | ORAL_CAPSULE | Freq: Three times a day (TID) | ORAL | 0 refills | Status: DC

## 2024-05-13 MED ORDER — IBUPROFEN 800 MG PO TABS: 800.0000 mg | ORAL_TABLET | Freq: Three times a day (TID) | ORAL | 0 refills | Status: DC | PRN

## 2024-05-13 MED ORDER — SODIUM CHLORIDE 0.9 % IV BOLUS: 1000.0000 mL | Freq: Once | INTRAVENOUS | Status: AC

## 2024-05-13 MED ORDER — DEXAMETHASONE SODIUM PHOSPHATE 10 MG/ML IJ SOLN
10.0000 mg | Freq: Once | INTRAMUSCULAR | Status: AC
  Administered 2024-05-13: 10 mg via INTRAVENOUS
  Filled 2024-05-13: qty 1

## 2024-05-13 NOTE — Progress Notes (Signed)
  Because you are already in the ER, I feel your condition warrants further evaluation and I recommend that you be seen in a face-to-face visit.   NOTE: There will be NO CHARGE for this E-Visit   If you are having a true medical emergency, please call 911.     For an urgent face to face visit, [place] has multiple urgent care centers for your convenience.  Click the link below for the full list of locations and hours, walk-in wait times, appointment scheduling options and driving directions:  Urgent Care - [place], [place], [place], [place], [place], [place]  [place]     Your MyChart E-visit questionnaire answers were reviewed by a board certified advanced clinical practitioner to complete your personal care plan based on your specific symptoms.    Thank you for using e-Visits.

## 2024-05-13 NOTE — Discharge Instructions (Signed)
 Return if any problems.

## 2024-05-13 NOTE — ED Notes (Addendum)
 Ambulated pt in hallway on room air. Pt had fast, even and steady gait. Pt denies dizziness shob or other issues.

## 2024-05-13 NOTE — ED Provider Notes (Signed)
 [place] EMERGENCY DEPARTMENT AT [facility] Provider Note   CSN: [id number] Arrival date & time: 05/13/24  8295     Patient presents with: Sore Throat   Jasmin Collins is a 58 y.o. female.   Patient complains of a sore throat.  Patient complains of swelling in both sides of her neck and the front of her neck.  Patient reports she has had a fever and chills.  Patient complains of difficulty swallowing.  Patient states she is losing her voice.  Patient states she is able to swallow her saliva.  She is having difficulty drinking fluids she is unable to eat.  Patient has a past medical history of hyperlipidemia and type 2 diabetes.  Patient has had a history of reflux.  Patient denies any exposure to anyone with sore throat she has not been around any children who had strep.  Patient denies any chest pain any abdominal pain she is not having any shortness of breath.   Sore Throat       Prior to Admission medications   Medication Sig Start Date End Date Taking? Authorizing Provider  Accu-Chek Softclix Lancets lancets Use as instructed 02/14/21   Lilland, Alana, DO  albuterol  (VENTOLIN  HFA) 108 (90 Base) MCG/ACT inhaler Inhale 2 puffs into the lungs every 6 (six) hours as needed for wheezing or shortness of breath. 09/24/23   Farris Hong, PA-C  atorvastatin  (LIPITOR) 40 MG tablet Take 1 tablet (40 mg total) by mouth daily. 12/21/23   Gomes, Adriana, DO  Blood Glucose Monitoring Suppl (ACCU-CHEK GUIDE) w/Device KIT 1 Device by Does not apply route daily. 02/14/21   Lilland, Alana, DO  Buprenorphine  HCl (BELBUCA ) 600 MCG FILM Place 1 Film (600 mcg) inside cheek 2 (two) times daily. 04/22/24     citalopram  (CELEXA ) 40 MG tablet Take 1 tablet (40 mg total) by mouth daily. 12/14/23   Gomes, Adriana, DO  Dulaglutide  (TRULICITY ) 3 MG/0.5ML SOAJ INJECT 3 MG AS DIRECTED ONCE A WEEK. 04/13/24   Clyda Dark, DO  fluticasone  (FLONASE ) 50 MCG/ACT nasal spray Place 2 sprays into both  nostrils daily. Patient not taking: Reported on 11/10/2023 08/22/22   Limmie Ren, MD  glucose blood (ACCU-CHEK GUIDE) test strip Use as instructed 02/14/21   Lilland, Alana, DO  hydrOXYzine  (ATARAX ) 50 MG tablet TAKE 1 TO 2 TABLETS(50 TO 100 MG) BY MOUTH EVERY 6 HOURS AS NEEDED FOR ANXIETY 04/13/24   Clyda Dark, DO  naloxone  (NARCAN ) nasal spray 4 mg/0.1 mL If found unresponsive then spray this into nose and call 911 immediately. 02/24/24     ondansetron  (ZOFRAN ) 4 MG tablet Take 1 tablet (4 mg total) by mouth every 8 (eight) hours as needed for nausea or vomiting. 04/02/24   Fleming, Zelda W, NP  oseltamivir  (TAMIFLU ) 75 MG capsule Take 1 capsule (75 mg total) by mouth 2 (two) times daily. 01/21/24   Farris Hong, PA-C  oxyCODONE -acetaminophen  (PERCOCET) 10-325 MG tablet Take 1 tablet by mouth 4 (four) times daily as needed. 10/23/23     oxyCODONE -acetaminophen  (PERCOCET) 10-325 MG tablet Take 1 tablet by mouth 4 (four) times daily as needed. 11/26/23     oxyCODONE -acetaminophen  (PERCOCET) 10-325 MG tablet Take 1 tablet by mouth 4 (four) times daily as needed. 04/22/24     pantoprazole  (PROTONIX ) 40 MG tablet TAKE 1 TABLET(40 MG) BY MOUTH DAILY 04/13/24   Clyda Dark, DO    Allergies: Patient has no known allergies.    Review of Systems  All other systems reviewed and are negative.   Updated Vital Signs BP (!) 146/85 (BP Location: Right Arm)   Pulse 81   Temp 98.4 F (36.9 C) (Oral)   Resp 18   Ht 5' 3 (1.6 m)   Wt 111.1 kg   LMP  (LMP Unknown)   SpO2 96%   BMI 43.40 kg/m   Physical Exam Vitals reviewed.  HENT:     Right Ear: Tympanic membrane normal.     Left Ear: Tympanic membrane normal.     Mouth/Throat:     Mouth: Mucous membranes are moist.     Pharynx: Posterior oropharyngeal erythema present.     Comments: Palate edema bilaterally  Eyes:     Conjunctiva/sclera: Conjunctivae normal.   Neck:     Comments: Tender submandibular, bilateral cervical  lymphadenopathy. Cardiovascular:     Rate and Rhythm: Normal rate.  Pulmonary:     Effort: Pulmonary effort is normal.  Abdominal:     Palpations: Abdomen is soft.  Lymphadenopathy:     Cervical: Cervical adenopathy present.   Skin:    General: Skin is warm.   Neurological:     Mental Status: She is alert.   Psychiatric:        Mood and Affect: Mood normal.     (all labs ordered are listed, but only abnormal results are displayed) Labs Reviewed  GROUP A STREP BY PCR - Abnormal; Notable for the following components:      Result Value   Group A Strep by PCR DETECTED (*)    All other components within normal limits  CBC WITH DIFFERENTIAL/PLATELET - Abnormal; Notable for the following components:   Hemoglobin 11.4 (*)    RDW 17.8 (*)    Neutro Abs 8.8 (*)    All other components within normal limits  BASIC METABOLIC PANEL WITH GFR - Abnormal; Notable for the following components:   Glucose, Bld 106 (*)    Calcium  8.7 (*)    All other components within normal limits    EKG: None  Radiology: CT Soft Tissue Neck W Contrast Result Date: 05/13/2024 CLINICAL DATA:  Soft tissue infection suspected, neck, xray done EXAM: CT NECK WITH CONTRAST TECHNIQUE: Multidetector CT imaging of the neck was performed using the standard protocol following the bolus administration of intravenous contrast. RADIATION DOSE REDUCTION: This exam was performed according to the departmental dose-optimization program which includes automated exposure control, adjustment of the mA and/or kV according to patient size and/or use of iterative reconstruction technique. CONTRAST:  75mL OMNIPAQUE IOHEXOL 350 MG/ML SOLN COMPARISON:  None Available. FINDINGS: Pharynx and larynx: There is diffuse nasopharyngeal and oro pharyngeal soft tissue swelling with edematous changes with within the adenoids. There is no ring-enhancing lesion to suggest abscess. The epiglottis is normal in thickness. The laryngeal soft tissues  are unremarkable. Salivary glands: The parotid and submandibular glands are normal. Thyroid: Normal. Lymph nodes: There are several shotty level 2 and 5 lymph nodes present bilaterally. There are no pathologically enlarged or morphologically suspicious nodes. Vascular: Widely patent. Limited intracranial: Unremarkable. Visualized orbits: Negative. Mastoids and visualized paranasal sinuses: Mild mucosal disease within the floor of the left maxillary sinus. Otherwise, clear to the extent demonstrated. Skeleton: Unremarkable. Upper chest: The visualized lung apices are clear. Other: None. IMPRESSION: 1. Pharyngitis with phlegmonous changes involving the adenoids. No discrete peritonsillar abscess. Electronically Signed   By: Maribeth Shivers M.D.   On: 05/13/2024 10:38     Procedures   Medications Ordered in  the ED  sodium chloride  0.9 % bolus 1,000 mL (0 mLs Intravenous Stopped 05/13/24 0908)  clindamycin (CLEOCIN) IVPB 600 mg (0 mg Intravenous Stopped 05/13/24 0908)  dexamethasone  (DECADRON ) injection 10 mg (10 mg Intravenous Given 05/13/24 0808)  iohexol (OMNIPAQUE) 350 MG/ML injection 75 mL (75 mLs Intravenous Contrast Given 05/13/24 1019)                                    Medical Decision Making Patient complains of a sore throat.  She complains of swelling in her neck.  Patient reports she is unable to eat and drink.  Patient states she is able to swallow her saliva but it causes pain  Amount and/or Complexity of Data Reviewed Labs: ordered. Decision-making details documented in ED Course.    Details: Lab was ordered reviewed and interpreted.  Strep screen is positive.  Hemoglobin is 11.4 glucose is 106 Radiology: ordered and independent interpretation performed. Decision-making details documented in ED Course.    Details: CT scan shows nasopharyngeal and oropharyngeal soft tissue swelling, lymphadenopathy  Risk Prescription drug management. Risk Details: Patient given IV fluids,  clindamycin and Decadron  IV.  Patient is given a prescription for clindamycin.  Patient is advised to return to the emergency department if any problems.        Final diagnoses:  None    ED Discharge Orders     None          Sandi Crosby, [place] 05/13/24 1104    Flonnie Humphrey, DO 05/13/24 1526

## 2024-05-13 NOTE — ED Triage Notes (Signed)
 C/o sore throat onset several days ago , Patient will not talk was able to whisper it hurts to talk, she is able to handle her secretions.

## 2024-05-21 ENCOUNTER — Other Ambulatory Visit (HOSPITAL_COMMUNITY): Payer: Self-pay

## 2024-05-21 MED ORDER — OXYCODONE-ACETAMINOPHEN 10-325 MG PO TABS
1.0000 | ORAL_TABLET | Freq: Four times a day (QID) | ORAL | 0 refills | Status: DC | PRN
  Filled 2024-05-21 – 2024-05-27 (×2): qty 120, 30d supply, fill #0
  Filled ????-??-??: fill #0

## 2024-05-23 ENCOUNTER — Other Ambulatory Visit (HOSPITAL_COMMUNITY): Payer: Self-pay

## 2024-05-26 ENCOUNTER — Other Ambulatory Visit (HOSPITAL_COMMUNITY): Payer: Self-pay

## 2024-05-27 ENCOUNTER — Other Ambulatory Visit (HOSPITAL_COMMUNITY): Payer: Self-pay

## 2024-06-06 ENCOUNTER — Other Ambulatory Visit: Payer: Self-pay | Admitting: Family Medicine

## 2024-06-06 DIAGNOSIS — Z1231 Encounter for screening mammogram for malignant neoplasm of breast: Secondary | ICD-10-CM

## 2024-06-18 ENCOUNTER — Other Ambulatory Visit (HOSPITAL_COMMUNITY): Payer: Self-pay

## 2024-06-18 MED ORDER — OXYCODONE-ACETAMINOPHEN 10-325 MG PO TABS
1.0000 | ORAL_TABLET | Freq: Four times a day (QID) | ORAL | 0 refills | Status: DC | PRN
  Filled 2024-06-27: qty 120, 30d supply, fill #0

## 2024-06-21 ENCOUNTER — Other Ambulatory Visit (HOSPITAL_COMMUNITY): Payer: Self-pay

## 2024-06-22 ENCOUNTER — Ambulatory Visit

## 2024-06-27 ENCOUNTER — Other Ambulatory Visit (HOSPITAL_COMMUNITY): Payer: Self-pay

## 2024-07-08 ENCOUNTER — Other Ambulatory Visit: Payer: Self-pay | Admitting: Family Medicine

## 2024-07-08 DIAGNOSIS — F419 Anxiety disorder, unspecified: Secondary | ICD-10-CM

## 2024-07-11 ENCOUNTER — Ambulatory Visit
Admission: EM | Admit: 2024-07-11 | Discharge: 2024-07-11 | Disposition: A | Discharge status: Home / Self Care | Attending: Emergency Medicine | Admitting: Emergency Medicine

## 2024-07-11 ENCOUNTER — Encounter

## 2024-07-11 ENCOUNTER — Encounter: Payer: Self-pay | Admitting: Family Medicine

## 2024-07-11 ENCOUNTER — Encounter: Payer: Self-pay | Admitting: Emergency Medicine

## 2024-07-11 ENCOUNTER — Ambulatory Visit

## 2024-07-11 NOTE — ED Triage Notes (Signed)
 Pt reports intermittent numbness and tingling of both hands x1 week. States this has been an ongoing problem for 3-4 months, but it came back this past week consistently. Pt states her PCP has already discussed cortisone injection or carpal tunnel surgery with her. Pt came to UC hoping for cortisone injection. She is aware this medication is not available at [facility] and wants to proceed with visit.

## 2024-07-12 ENCOUNTER — Ambulatory Visit: Admitting: Family Medicine

## 2024-07-13 ENCOUNTER — Ambulatory Visit

## 2024-07-13 VITALS — BP 132/84 | HR 70 | Ht 63.0 in | Wt 244.0 lb

## 2024-07-13 DIAGNOSIS — G5603 Carpal tunnel syndrome, bilateral upper limbs: Secondary | ICD-10-CM | POA: Diagnosis not present

## 2024-07-13 NOTE — Progress Notes (Signed)
    SUBJECTIVE:   CHIEF COMPLAINT / HPI:   Tingling and burning in hands. She has previously been diagnosed with carpal tunnel and advised to follow-up with orthopedic surgery however she has not heard from them yet She did not try to contact them yet  Patient reports she has had worsening of her symptoms over the past week and especially over the last day or 2.  Her fingers are constantly tingling and painful.  She has not tried any medications but has been wearing her brace at night  PERTINENT  PMH / PSH: T2DM, HLD, anxiety, carpal tunnel  OBJECTIVE:   BP 132/84   Pulse 70   Ht 5' 3 (1.6 m)   Wt 244 lb (110.7 kg)   LMP  (LMP Unknown)   SpO2 99%   BMI 43.22 kg/m   General: NAD, pleasant, able to participate in exam Respiratory: No respiratory distress Skin: warm and dry, no rashes noted Psych: Normal affect and mood  MSK: Positive Tinel and Phalen bilaterally, sensation intact distally, normal radial pulse bilaterally, normal grip strength  ASSESSMENT/PLAN:   Assessment & Plan Carpal tunnel syndrome on both sides Recommend follow-up with [facility], referral was placed previously and has gone through.  I provided her with contact information for [facility] and also advised that they have a walk-in clinic at their [place] location Otherwise advised OTC NSAIDs and continued neutral bracing of both wrists until she is able to see them   Payton Coward, MD [facility]

## 2024-07-13 NOTE — Patient Instructions (Addendum)
 [facility] [place] [address] (Floor 2) [place], [place] [address] Phone: [phone]  Please give a call to [facility] to try and schedule an appointment.  They also have a walk-in clinic at their address above.  In the meantime you can use Aleve  or ibuprofen  to help with your symptoms.  I recommend wearing your brace regularly especially while asleep.

## 2024-07-15 ENCOUNTER — Ambulatory Visit
Admission: RE | Admit: 2024-07-15 | Discharge: 2024-07-15 | Disposition: A | Discharge status: Home / Self Care | Source: Ambulatory Visit | Attending: Family Medicine | Admitting: Family Medicine

## 2024-07-15 ENCOUNTER — Other Ambulatory Visit (HOSPITAL_COMMUNITY): Payer: Self-pay

## 2024-07-15 DIAGNOSIS — Z1231 Encounter for screening mammogram for malignant neoplasm of breast: Secondary | ICD-10-CM

## 2024-07-15 MED ORDER — OXYCODONE-ACETAMINOPHEN 10-325 MG PO TABS
1.0000 | ORAL_TABLET | Freq: Four times a day (QID) | ORAL | 0 refills | Status: DC | PRN
  Filled 2024-07-27 – 2024-07-28 (×2): qty 120, 30d supply, fill #0

## 2024-07-19 ENCOUNTER — Other Ambulatory Visit: Payer: Self-pay | Admitting: Family Medicine

## 2024-07-19 DIAGNOSIS — E119 Type 2 diabetes mellitus without complications: Secondary | ICD-10-CM

## 2024-07-19 NOTE — Telephone Encounter (Signed)
 See other refills encounter.

## 2024-07-27 ENCOUNTER — Other Ambulatory Visit (HOSPITAL_COMMUNITY): Payer: Self-pay

## 2024-07-28 ENCOUNTER — Other Ambulatory Visit (HOSPITAL_COMMUNITY): Payer: Self-pay

## 2024-08-11 ENCOUNTER — Telehealth: Admitting: Physician Assistant

## 2024-08-11 DIAGNOSIS — B349 Viral infection, unspecified: Secondary | ICD-10-CM

## 2024-08-11 DIAGNOSIS — J029 Acute pharyngitis, unspecified: Secondary | ICD-10-CM

## 2024-08-11 MED ORDER — PROMETHAZINE-DM 6.25-15 MG/5ML PO SYRP: 5.0000 mL | ORAL_SOLUTION | Freq: Four times a day (QID) | ORAL | 0 refills | Status: DC | PRN

## 2024-08-11 MED ORDER — IBUPROFEN 600 MG PO TABS: 600.0000 mg | ORAL_TABLET | Freq: Three times a day (TID) | ORAL | 0 refills | Status: DC | PRN

## 2024-08-11 MED ORDER — COVID-19 ANTIBODY TEST VI KIT: PACK | 0 refills | Status: DC

## 2024-08-11 NOTE — Progress Notes (Signed)
   Thank you for the details you included in the comment boxes. Those details are very helpful in determining the best course of treatment for you and help [place] to provide the best care. We recommend that you schedule a Virtual Urgent Care video visit in order for the provider to better assess what is going on.  The provider will be able to give you a more accurate diagnosis and treatment plan if we can more freely discuss your symptoms and with the addition of a virtual examination.   If you change your visit to a video visit, we will bill your insurance (similar to an office visit) and you will not be charged for this e-Visit. You will be able to stay at home and speak with the first available [facility] advanced practice provider. The link to do a video visit is in the drop down Menu tab of your Welcome screen in MyChart.       I have spent 5 minutes in review of e-visit questionnaire, review and updating patient chart, medical decision making and response to patient.   Margaretann Loveless, PA-C

## 2024-08-11 NOTE — Patient Instructions (Signed)
 Jasmin Collins, thank you for joining Delon [name], PA-C for today's virtual visit.  While this provider is not your primary care provider (PCP), if your PCP is located in our provider database this encounter information will be shared with them immediately following your visit.   A [facility] MyChart account gives you access to today's visit and all your visits, tests, and labs performed at [facility]  click here if you don't have a [facility] MyChart account or go to [email or web address]  Consent: (Patient) Jasmin Collins provided verbal consent for this virtual visit at the beginning of the encounter.  Current Medications:  Current Outpatient Medications:    COVID-19 Antibody Test KIT, Use as directed on package instructions, Disp: 1 kit, Rfl: 0   ibuprofen  (ADVIL ) 600 MG tablet, Take 1 tablet (600 mg total) by mouth every 8 (eight) hours as needed., Disp: 30 tablet, Rfl: 0   promethazine -dextromethorphan (PROMETHAZINE -DM) 6.25-15 MG/5ML syrup, Take 5 mLs by mouth 4 (four) times daily as needed., Disp: 118 mL, Rfl: 0   atorvastatin  (LIPITOR) 40 MG tablet, Take 1 tablet (40 mg total) by mouth daily., Disp: 90 tablet, Rfl: 3   Buprenorphine  HCl (BELBUCA ) 600 MCG FILM, Place 1 Film (600 mcg) inside cheek 2 (two) times daily., Disp: 60 each, Rfl: 0   citalopram  (CELEXA ) 40 MG tablet, TAKE 1 TABLET BY MOUTH EVERY DAY, Disp: 90 tablet, Rfl: 1   Dulaglutide  (TRULICITY ) 3 MG/0.5ML SOAJ, INJECT 3 MG AS DIRECTED ONCE A WEEK., Disp: 6 mL, Rfl: 0   hydrOXYzine  (ATARAX ) 50 MG tablet, TAKE 1 TO 2 TABLETS(50 TO 100 MG) BY MOUTH EVERY 6 HOURS AS NEEDED FOR ANXIETY, Disp: 60 tablet, Rfl: 2   oxyCODONE -acetaminophen  (PERCOCET) 10-325 MG tablet, Take 1 tablet by mouth 4 (four) times daily as needed., Disp: 120 tablet, Rfl: 0   oxyCODONE -acetaminophen  (PERCOCET) 10-325 MG tablet, Take 1 tablet by mouth 4 (four) times daily as needed., Disp: 120 tablet, Rfl: 0    oxyCODONE -acetaminophen  (PERCOCET) 10-325 MG tablet, Take 1 tablet by mouth 4 (four) times daily as needed., Disp: 120 tablet, Rfl: 0   pantoprazole  (PROTONIX ) 40 MG tablet, TAKE 1 TABLET(40 MG) BY MOUTH DAILY, Disp: 30 tablet, Rfl: 0   Medications ordered in this encounter:  Meds ordered this encounter  Medications   promethazine -dextromethorphan (PROMETHAZINE -DM) 6.25-15 MG/5ML syrup    Sig: Take 5 mLs by mouth 4 (four) times daily as needed.    Dispense:  118 mL    Refill:  0    Supervising Provider:   BLAISE ALEENE KIDD [id number]   ibuprofen  (ADVIL ) 600 MG tablet    Sig: Take 1 tablet (600 mg total) by mouth every 8 (eight) hours as needed.    Dispense:  30 tablet    Refill:  0    Supervising Provider:   BLAISE ALEENE KIDD [[id number]]   COVID-19 Antibody Test KIT    Sig: Use as directed on package instructions    Dispense:  1 kit    Refill:  0    Supervising Provider:   BLAISE ALEENE KIDD [[id number]]     *If you need refills on other medications prior to your next appointment, please contact your pharmacy*  Follow-Up: Call back or seek an in-person evaluation if the symptoms worsen or if the condition fails to improve as anticipated.  [facility] [phone]  Other Instructions  Viral Illness, Adult Viruses are tiny germs that can get into a person's body and cause  illness. There are many different types of viruses. And they cause many types of illness. Viral illnesses can range from mild to severe. They can affect various parts of the body. Short-term conditions that are caused by a virus include colds and flu (influenza) and stomach viruses. Long-term conditions that are caused by a virus include herpes, shingles, and human immunodeficiency virus (HIV) infection. A few viruses have been linked to certain cancers. What are the causes? Many types of viruses can cause illness. Viruses get into cells in your body, multiply, and cause the infected cells to work  differently or die. When these cells die, they release more of the virus. When this happens, you get symptoms of the illness and the virus spreads to other cells. If the virus takes over how the cell works, it can cause the cell to divide and grow out of control. This happens when a virus causes cancer. Different viruses get into the body in different ways. You can get a virus by: Swallowing food or water that has come in contact with the virus. Breathing in droplets that have been coughed or sneezed into the air by an infected person. Touching a surface that has the virus on it and then touching your eyes, nose, or mouth. Being bitten by an insect or animal that carries the virus. Having sexual contact with a person who is infected with the virus. Being exposed to blood or fluids that contain the virus, either through an open cut or during a transfusion. If a virus enters your body, your body's disease-fighting system (immune system) will try to fight the virus. You may be at higher risk for a viral illness if your immune system is weak. What are the signs or symptoms? Symptoms depend on the type of virus and the location of the cells that it gets into. Symptoms can include: For cold and flu viruses: Fever. Headache. Sore throat. Muscle aches. Stuffy nose (nasal congestion). Cough. For stomach (gastrointestinal) viruses: Fever. Pain in the abdomen. Nausea or vomiting. Diarrhea. For liver viruses (hepatitis): Loss of appetite. Feeling tired. Skin or the white parts of your eyes turning yellow (jaundice). For brain and spinal cord viruses: Fever. Headache. Stiff neck. Nausea and vomiting. Confusion or being sleepy. For skin viruses: Warts. Itching. Rash. For sexually transmitted viruses: Discharge. Swelling. Redness. Rash. How is this diagnosed? This condition may be diagnosed based on one or more of these: Your symptoms and medical history. A physical exam. Tests, such  as: Blood tests. Tests on a sample of mucus from your lungs (sputum sample). Tests on a poop (stool) sample. Tests on a swab of body fluids or a skin sore (lesion). How is this treated? Viruses can be hard to treat because they live within cells. Antibiotics do not treat viruses because these medicines do not get inside cells. Treatment for a viral illness may include: Resting and drinking a lot of fluids. Medicines to treat symptoms. These can include over-the-counter medicine for pain and fever, medicines for cough or congestion, and medicines for diarrhea. Antiviral medicines. These medicines are available only for certain types of viruses. Some viral illnesses can be prevented with vaccinations. A common example is the flu shot. Follow these instructions at home: Medicines Take over-the-counter and prescription medicines only as told by your health care provider. If you were prescribed an antiviral medicine, take it as told by your provider. Do not stop taking the antiviral even if you start to feel better. Know when antibiotics are needed  and when they are not needed. Antibiotics do not treat viruses. You may get an antibiotic if your provider thinks that you may have, or are at risk for, a bacterial infection and you have a viral infection. Do not ask for an antibiotic prescription if you have been diagnosed with a viral illness. Antibiotics will not make your illness go away faster. Taking antibiotics when they are not needed can lead to antibiotic resistance. When this develops, the medicine no longer works against the bacteria that it normally fights. General instructions Drink enough fluids to keep your pee (urine) pale yellow. Rest as much as possible. Return to your normal activities as told by your provider. Ask your provider what activities are safe for you. How is this prevented? To lower your risk of getting another viral illness: Wash your hands often with soap and water for  at least 20 seconds. If soap and water are not available, use hand sanitizer. Avoid touching your nose, eyes, and mouth, especially if you have not washed your hands recently. If anyone in your household has a viral infection, clean all household surfaces that may have been in contact with the virus. Use soap and hot water. You may also use a commercially prepared, bleach-containing solution. Stay away from people who are sick with symptoms of a viral infection. Do not share items such as toothbrushes and water bottles with other people. Keep your vaccinations up to date. This includes getting a yearly flu shot. Eat a healthy diet and get plenty of rest. Contact a health care provider if: You have symptoms of a viral illness that do not go away. Your symptoms come back after going away. Your symptoms get worse. Get help right away if: You have trouble breathing. You have a severe headache or a stiff neck. You have severe vomiting or pain in your abdomen. These symptoms may be an emergency. Get help right away. Call 911. Do not wait to see if the symptoms will go away. Do not drive yourself to the hospital. This information is not intended to replace advice given to you by your health care provider. Make sure you discuss any questions you have with your health care provider. Document Revised: 12/03/2022 Document Reviewed: 09/17/2022 Elsevier Patient Education  2024 Elsevier Inc.   If you have been instructed to have an in-person evaluation today at a local Urgent Care facility, please use the link below. It will take you to a list of all of our available [facility], including address, phone number and hours of operation. Please do not delay care.  [facility]  If you or a family member do not have a primary care provider, use the link below to schedule a visit and establish care. When you choose a [facility] primary care physician or advanced practice provider,  you gain a long-term partner in health. Find a Primary Care Provider  Learn more about [facility]'s in-office and virtual care options: [facility] - Get Care Now

## 2024-08-11 NOTE — Progress Notes (Signed)
 Virtual Visit Consent   Jasmin Collins, you are scheduled for a virtual visit with a [facility] provider today. Just as with appointments in the office, your consent must be obtained to participate. Your consent will be active for this visit and any virtual visit you may have with one of our providers in the next 365 days. If you have a MyChart account, a copy of this consent can be sent to you electronically.  As this is a virtual visit, video technology does not allow for your provider to perform a traditional examination. This may limit your provider's ability to fully assess your condition. If your provider identifies any concerns that need to be evaluated in person or the need to arrange testing (such as labs, EKG, etc.), we will make arrangements to do so. Although advances in technology are sophisticated, we cannot ensure that it will always work on either your end or our end. If the connection with a video visit is poor, the visit may have to be switched to a telephone visit. With either a video or telephone visit, we are not always able to ensure that we have a secure connection.  By engaging in this virtual visit, you consent to the provision of healthcare and authorize for your insurance to be billed (if applicable) for the services provided during this visit. Depending on your insurance coverage, you may receive a charge related to this service.  I need to obtain your verbal consent now. Are you willing to proceed with your visit today? Jasmin Collins has provided verbal consent on 08/11/2024 for a virtual visit (video or telephone). Delon [name], PA-C  Date: 08/11/2024 10:32 AM   Virtual Visit via Video Note   I, Delon [name], connected with  Jasmin Collins  ([id number], 1966/04/28) on 08/11/24 at 10:30 AM EDT by a video-enabled telemedicine application and verified that I am speaking with the correct person using two identifiers.  Location: Patient: Virtual Visit  Location Patient: Home Provider: Virtual Visit Location Provider: Home Office   I discussed the limitations of evaluation and management by telemedicine and the availability of in person appointments. The patient expressed understanding and agreed to proceed.    History of Present Illness: Jasmin Collins is a 58 y.o. who identifies as a female who was assigned female at birth, and is being seen today for sore throat and night sweats.  HPI: Sore Throat  This is a new problem. The current episode started yesterday. The problem has been unchanged. There has been no fever. The pain is moderate. Associated symptoms include coughing (dry), trouble swallowing and vomiting (from coughing). Pertinent negatives include no congestion, diarrhea, ear discharge, ear pain, headaches, plugged ear sensation or shortness of breath. Associated symptoms comments: Sweats over night, dry mouth, feels fatigue, body sore. 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 She has tried nothing for the symptoms. The treatment provided no relief.     Problems:  Patient Active Problem List   Diagnosis Date Noted   Alopecia 03/24/2022   Hyperlipidemia associated with type 2 diabetes mellitus (HCC) 11/12/2021   Anemia 05/30/2021   Type 2 diabetes mellitus without complication, without long-term current use of insulin (HCC) 02/14/2021   Anxiety 02/14/2021   Acute bilateral low back pain without sciatica 10/05/2020   Gastroesophageal reflux disease without esophagitis 10/05/2020    Allergies: No Known Allergies Medications:  Current Outpatient Medications:    COVID-19 Antibody Test KIT, Use as directed on package instructions,  Disp: 1 kit, Rfl: 0   ibuprofen  (ADVIL ) 600 MG tablet, Take 1 tablet (600 mg total) by mouth every 8 (eight) hours as needed., Disp: 30 tablet, Rfl: 0   promethazine -dextromethorphan (PROMETHAZINE -DM) 6.25-15 MG/5ML syrup, Take 5 mLs by mouth 4 (four) times daily as needed., Disp: 118 mL, Rfl: 0    atorvastatin  (LIPITOR) 40 MG tablet, Take 1 tablet (40 mg total) by mouth daily., Disp: 90 tablet, Rfl: 3   Buprenorphine  HCl (BELBUCA ) 600 MCG FILM, Place 1 Film (600 mcg) inside cheek 2 (two) times daily., Disp: 60 each, Rfl: 0   citalopram  (CELEXA ) 40 MG tablet, TAKE 1 TABLET BY MOUTH EVERY DAY, Disp: 90 tablet, Rfl: 1   Dulaglutide  (TRULICITY ) 3 MG/0.5ML SOAJ, INJECT 3 MG AS DIRECTED ONCE A WEEK., Disp: 6 mL, Rfl: 0   hydrOXYzine  (ATARAX ) 50 MG tablet, TAKE 1 TO 2 TABLETS(50 TO 100 MG) BY MOUTH EVERY 6 HOURS AS NEEDED FOR ANXIETY, Disp: 60 tablet, Rfl: 2   oxyCODONE -acetaminophen  (PERCOCET) 10-325 MG tablet, Take 1 tablet by mouth 4 (four) times daily as needed., Disp: 120 tablet, Rfl: 0   oxyCODONE -acetaminophen  (PERCOCET) 10-325 MG tablet, Take 1 tablet by mouth 4 (four) times daily as needed., Disp: 120 tablet, Rfl: 0   oxyCODONE -acetaminophen  (PERCOCET) 10-325 MG tablet, Take 1 tablet by mouth 4 (four) times daily as needed., Disp: 120 tablet, Rfl: 0   pantoprazole  (PROTONIX ) 40 MG tablet, TAKE 1 TABLET(40 MG) BY MOUTH DAILY, Disp: 30 tablet, Rfl: 0  Observations/Objective: Patient is well-developed, well-nourished in no acute distress.  Resting comfortably at home.  Head is normocephalic, atraumatic.  No labored breathing.  Speech is clear and coherent with logical content.  Patient is alert and oriented at baseline.    Assessment and Plan: 1. Viral illness (Primary) - promethazine -dextromethorphan (PROMETHAZINE -DM) 6.25-15 MG/5ML syrup; Take 5 mLs by mouth 4 (four) times daily as needed.  Dispense: 118 mL; Refill: 0 - ibuprofen  (ADVIL ) 600 MG tablet; Take 1 tablet (600 mg total) by mouth every 8 (eight) hours as needed.  Dispense: 30 tablet; Refill: 0 - COVID-19 Antibody Test KIT; Use as directed on package instructions  Dispense: 1 kit; Refill: 0  - Suspect viral URI, advise to take at home Covid test - Symptomatic medications of choice over the counter as needed - Added  Promethazine  DM for cough and congestion - Ibuprofen  for generalized aches and pains - Push fluids - Rest - Work note provided - Seek further evaluation if symptoms change or worsen   Follow Up Instructions: I discussed the assessment and treatment plan with the patient. The patient was provided an opportunity to ask questions and all were answered. The patient agreed with the plan and demonstrated an understanding of the instructions.  A copy of instructions were sent to the patient via MyChart unless otherwise noted below.    The patient was advised to call back or seek an in-person evaluation if the symptoms worsen or if the condition fails to improve as anticipated.    Delon [name], PA-C

## 2024-08-12 ENCOUNTER — Other Ambulatory Visit (HOSPITAL_COMMUNITY): Payer: Self-pay

## 2024-08-12 MED ORDER — OXYCODONE-ACETAMINOPHEN 10-325 MG PO TABS
1.0000 | ORAL_TABLET | Freq: Four times a day (QID) | ORAL | 0 refills | Status: DC | PRN
  Filled 2024-08-27: qty 120, 30d supply, fill #0

## 2024-08-24 ENCOUNTER — Other Ambulatory Visit (HOSPITAL_COMMUNITY): Payer: Self-pay

## 2024-08-27 ENCOUNTER — Other Ambulatory Visit (HOSPITAL_COMMUNITY): Payer: Self-pay

## 2024-09-04 ENCOUNTER — Telehealth: Admitting: Family Medicine

## 2024-09-04 DIAGNOSIS — J4540 Moderate persistent asthma, uncomplicated: Secondary | ICD-10-CM | POA: Diagnosis not present

## 2024-09-04 DIAGNOSIS — B349 Viral infection, unspecified: Secondary | ICD-10-CM | POA: Diagnosis not present

## 2024-09-04 MED ORDER — PREDNISONE 20 MG PO TABS: 20.0000 mg | ORAL_TABLET | Freq: Two times a day (BID) | ORAL | 0 refills | Status: AC

## 2024-09-04 MED ORDER — ALBUTEROL SULFATE HFA 108 (90 BASE) MCG/ACT IN AERS: 2.0000 | INHALATION_SPRAY | Freq: Four times a day (QID) | RESPIRATORY_TRACT | 0 refills | Status: DC | PRN

## 2024-09-04 MED ORDER — PROMETHAZINE-DM 6.25-15 MG/5ML PO SYRP: 5.0000 mL | ORAL_SOLUTION | Freq: Four times a day (QID) | ORAL | 0 refills | Status: AC | PRN

## 2024-09-04 NOTE — Patient Instructions (Signed)

## 2024-09-04 NOTE — Progress Notes (Signed)
 Virtual Visit Consent   Jasmin Collins, you are scheduled for a virtual visit with a [facility] provider today. Just as with appointments in the office, your consent must be obtained to participate. Your consent will be active for this visit and any virtual visit you may have with one of our providers in the next 365 days. If you have a MyChart account, a copy of this consent can be sent to you electronically.  As this is a virtual visit, video technology does not allow for your provider to perform a traditional examination. This may limit your provider's ability to fully assess your condition. If your provider identifies any concerns that need to be evaluated in person or the need to arrange testing (such as labs, EKG, etc.), we will make arrangements to do so. Although advances in technology are sophisticated, we cannot ensure that it will always work on either your end or our end. If the connection with a video visit is poor, the visit may have to be switched to a telephone visit. With either a video or telephone visit, we are not always able to ensure that we have a secure connection.  By engaging in this virtual visit, you consent to the provision of healthcare and authorize for your insurance to be billed (if applicable) for the services provided during this visit. Depending on your insurance coverage, you may receive a charge related to this service.  I need to obtain your verbal consent now. Are you willing to proceed with your visit today? Jasmin Collins has provided verbal consent on 09/04/2024 for a virtual visit (video or telephone). Loa Lamp, FNP  Date: 09/04/2024 10:13 AM   Virtual Visit via Video Note   I, Loa Lamp, connected with  Jasmin Collins  ([id number], [birth year]) on 09/04/24 at 11:15 AM EDT by a video-enabled telemedicine application and verified that I am speaking with the correct person using two identifiers.  Location: Patient: Virtual Visit Location  Patient: Home Provider: Virtual Visit Location Provider: Home Office   I discussed the limitations of evaluation and management by telemedicine and the availability of in person appointments. The patient expressed understanding and agreed to proceed.    History of Present Illness: Jasmin Collins is a 58 y.o. who identifies as a female who was assigned female at birth, and is being seen today for cough with wheezing and sob. Requests prednisone  and albuterol  with cough meds. No fever. In no distress. Sx for 2 days worsening. SABRA  HPI: HPI  Problems:  Patient Active Problem List   Diagnosis Date Noted   Alopecia 03/24/2022   Hyperlipidemia associated with type 2 diabetes mellitus (HCC) 11/12/2021   Anemia 05/30/2021   Type 2 diabetes mellitus without complication, without long-term current use of insulin (HCC) 02/14/2021   Anxiety 02/14/2021   Acute bilateral low back pain without sciatica 10/05/2020   Gastroesophageal reflux disease without esophagitis 10/05/2020    Allergies: No Known Allergies Medications:  Current Outpatient Medications:    albuterol  (VENTOLIN  HFA) 108 (90 Base) MCG/ACT inhaler, Inhale 2 puffs into the lungs every 6 (six) hours as needed for wheezing or shortness of breath., Disp: 8 g, Rfl: 0   predniSONE  (DELTASONE ) 20 MG tablet, Take 1 tablet (20 mg total) by mouth 2 (two) times daily with a meal for 5 days., Disp: 10 tablet, Rfl: 0   promethazine -dextromethorphan (PROMETHAZINE -DM) 6.25-15 MG/5ML syrup, Take 5 mLs by mouth 4 (four) times daily as needed for up to 10 days  for cough., Disp: 118 mL, Rfl: 0   atorvastatin  (LIPITOR) 40 MG tablet, Take 1 tablet (40 mg total) by mouth daily., Disp: 90 tablet, Rfl: 3   Buprenorphine  HCl (BELBUCA ) 600 MCG FILM, Place 1 Film (600 mcg) inside cheek 2 (two) times daily., Disp: 60 each, Rfl: 0   citalopram  (CELEXA ) 40 MG tablet, TAKE 1 TABLET BY MOUTH EVERY DAY, Disp: 90 tablet, Rfl: 1   COVID-19 Antibody Test KIT, Use as  directed on package instructions, Disp: 1 kit, Rfl: 0   Dulaglutide  (TRULICITY ) 3 MG/0.5ML SOAJ, INJECT 3 MG AS DIRECTED ONCE A WEEK., Disp: 6 mL, Rfl: 0   hydrOXYzine  (ATARAX ) 50 MG tablet, TAKE 1 TO 2 TABLETS(50 TO 100 MG) BY MOUTH EVERY 6 HOURS AS NEEDED FOR ANXIETY, Disp: 60 tablet, Rfl: 2   ibuprofen  (ADVIL ) 600 MG tablet, Take 1 tablet (600 mg total) by mouth every 8 (eight) hours as needed., Disp: 30 tablet, Rfl: 0   oxyCODONE -acetaminophen  (PERCOCET) 10-325 MG tablet, Take 1 tablet by mouth 4 (four) times daily as needed., Disp: 120 tablet, Rfl: 0   oxyCODONE -acetaminophen  (PERCOCET) 10-325 MG tablet, Take 1 tablet by mouth 4 (four) times daily as needed., Disp: 120 tablet, Rfl: 0   oxyCODONE -acetaminophen  (PERCOCET) 10-325 MG tablet, Take 1 tablet by mouth 4 (four) times daily as needed., Disp: 120 tablet, Rfl: 0   pantoprazole  (PROTONIX ) 40 MG tablet, TAKE 1 TABLET(40 MG) BY MOUTH DAILY, Disp: 30 tablet, Rfl: 0   promethazine -dextromethorphan (PROMETHAZINE -DM) 6.25-15 MG/5ML syrup, Take 5 mLs by mouth 4 (four) times daily as needed., Disp: 118 mL, Rfl: 0  Observations/Objective: Patient is well-developed, well-nourished in no acute distress.  Resting comfortably  at home.  Head is normocephalic, atraumatic.  No labored breathing.  Speech is clear and coherent with logical content.  Patient is alert and oriented at baseline.    Assessment and Plan: 1. Moderate persistent asthmatic bronchitis without complication (Primary)  2. Viral illness  Increase fluid,humidifier at night, uc as needed.  Follow Up Instructions: I discussed the assessment and treatment plan with the patient. The patient was provided an opportunity to ask questions and all were answered. The patient agreed with the plan and demonstrated an understanding of the instructions.  A copy of instructions were sent to the patient via MyChart unless otherwise noted below.     The patient was advised to call back or  seek an in-person evaluation if the symptoms worsen or if the condition fails to improve as anticipated.    Thorne Wirz, FNP

## 2024-09-14 ENCOUNTER — Other Ambulatory Visit (HOSPITAL_COMMUNITY): Payer: Self-pay

## 2024-09-14 MED ORDER — OXYCODONE-ACETAMINOPHEN 10-325 MG PO TABS
1.0000 | ORAL_TABLET | Freq: Four times a day (QID) | ORAL | 0 refills | Status: DC | PRN
  Filled 2024-09-26: qty 120, 30d supply, fill #0

## 2024-09-21 ENCOUNTER — Other Ambulatory Visit (HOSPITAL_COMMUNITY): Payer: Self-pay

## 2024-09-22 ENCOUNTER — Other Ambulatory Visit: Payer: Self-pay | Admitting: Family Medicine

## 2024-09-22 DIAGNOSIS — E1169 Type 2 diabetes mellitus with other specified complication: Secondary | ICD-10-CM

## 2024-09-22 DIAGNOSIS — F419 Anxiety disorder, unspecified: Secondary | ICD-10-CM

## 2024-09-26 ENCOUNTER — Other Ambulatory Visit (HOSPITAL_COMMUNITY): Payer: Self-pay

## 2024-09-27 ENCOUNTER — Encounter (HOSPITAL_COMMUNITY): Payer: Self-pay

## 2024-09-27 ENCOUNTER — Other Ambulatory Visit (HOSPITAL_COMMUNITY): Payer: Self-pay

## 2024-09-27 ENCOUNTER — Other Ambulatory Visit: Payer: Self-pay | Admitting: Family Medicine

## 2024-09-27 DIAGNOSIS — B349 Viral infection, unspecified: Secondary | ICD-10-CM

## 2024-09-27 MED ORDER — PROMETHAZINE-DM 6.25-15 MG/5ML PO SYRP: 5.0000 mL | ORAL_SOLUTION | Freq: Four times a day (QID) | ORAL | 0 refills | Status: DC | PRN

## 2024-09-27 MED ORDER — SUFLAVE 178.7 G PO SOLR
ORAL | 0 refills | Status: DC
  Filled 2024-09-27: qty 2, 2d supply, fill #0

## 2024-10-03 ENCOUNTER — Other Ambulatory Visit: Payer: Self-pay | Admitting: Family Medicine

## 2024-10-03 DIAGNOSIS — E119 Type 2 diabetes mellitus without complications: Secondary | ICD-10-CM

## 2024-10-04 ENCOUNTER — Emergency Department (HOSPITAL_COMMUNITY)
Admission: EM | Admit: 2024-10-04 | Discharge: 2024-10-04 | Discharge status: Against Medical Advice | Source: Home / Self Care

## 2024-10-04 ENCOUNTER — Telehealth

## 2024-10-04 ENCOUNTER — Emergency Department (HOSPITAL_BASED_OUTPATIENT_CLINIC_OR_DEPARTMENT_OTHER)

## 2024-10-04 ENCOUNTER — Other Ambulatory Visit: Payer: Self-pay

## 2024-10-04 ENCOUNTER — Ambulatory Visit (INDEPENDENT_AMBULATORY_CARE_PROVIDER_SITE_OTHER): Admitting: Family Medicine

## 2024-10-04 ENCOUNTER — Inpatient Hospital Stay (HOSPITAL_BASED_OUTPATIENT_CLINIC_OR_DEPARTMENT_OTHER)
Admission: EM | Admit: 2024-10-04 | Discharge: 2024-10-06 | DRG: 854 | Disposition: A | Discharge status: Home / Self Care | Attending: Internal Medicine | Admitting: Internal Medicine

## 2024-10-04 VITALS — BP 149/69 | HR 97

## 2024-10-04 DIAGNOSIS — N111 Chronic obstructive pyelonephritis: Secondary | ICD-10-CM | POA: Diagnosis present

## 2024-10-04 DIAGNOSIS — R42 Dizziness and giddiness: Secondary | ICD-10-CM | POA: Insufficient documentation

## 2024-10-04 DIAGNOSIS — E66813 Obesity, class 3: Secondary | ICD-10-CM | POA: Diagnosis present

## 2024-10-04 DIAGNOSIS — R109 Unspecified abdominal pain: Secondary | ICD-10-CM

## 2024-10-04 DIAGNOSIS — K219 Gastro-esophageal reflux disease without esophagitis: Secondary | ICD-10-CM | POA: Diagnosis present

## 2024-10-04 DIAGNOSIS — N200 Calculus of kidney: Principal | ICD-10-CM

## 2024-10-04 DIAGNOSIS — D6489 Other specified anemias: Secondary | ICD-10-CM

## 2024-10-04 DIAGNOSIS — E860 Dehydration: Secondary | ICD-10-CM | POA: Diagnosis present

## 2024-10-04 DIAGNOSIS — Z5321 Procedure and treatment not carried out due to patient leaving prior to being seen by health care provider: Secondary | ICD-10-CM | POA: Insufficient documentation

## 2024-10-04 DIAGNOSIS — R310 Gross hematuria: Secondary | ICD-10-CM | POA: Diagnosis present

## 2024-10-04 DIAGNOSIS — G8929 Other chronic pain: Secondary | ICD-10-CM | POA: Diagnosis not present

## 2024-10-04 DIAGNOSIS — N179 Acute kidney failure, unspecified: Secondary | ICD-10-CM | POA: Diagnosis present

## 2024-10-04 DIAGNOSIS — N390 Urinary tract infection, site not specified: Secondary | ICD-10-CM | POA: Diagnosis present

## 2024-10-04 DIAGNOSIS — Z6841 Body Mass Index (BMI) 40.0 and over, adult: Secondary | ICD-10-CM

## 2024-10-04 DIAGNOSIS — E119 Type 2 diabetes mellitus without complications: Secondary | ICD-10-CM | POA: Diagnosis present

## 2024-10-04 DIAGNOSIS — Z79899 Other long term (current) drug therapy: Secondary | ICD-10-CM

## 2024-10-04 DIAGNOSIS — R319 Hematuria, unspecified: Secondary | ICD-10-CM | POA: Insufficient documentation

## 2024-10-04 DIAGNOSIS — Z8249 Family history of ischemic heart disease and other diseases of the circulatory system: Secondary | ICD-10-CM

## 2024-10-04 DIAGNOSIS — R0602 Shortness of breath: Secondary | ICD-10-CM | POA: Insufficient documentation

## 2024-10-04 DIAGNOSIS — A419 Sepsis, unspecified organism: Principal | ICD-10-CM | POA: Diagnosis present

## 2024-10-04 DIAGNOSIS — Z466 Encounter for fitting and adjustment of urinary device: Secondary | ICD-10-CM

## 2024-10-04 DIAGNOSIS — F419 Anxiety disorder, unspecified: Secondary | ICD-10-CM | POA: Diagnosis present

## 2024-10-04 DIAGNOSIS — N136 Pyonephrosis: Secondary | ICD-10-CM | POA: Diagnosis present

## 2024-10-04 DIAGNOSIS — D649 Anemia, unspecified: Secondary | ICD-10-CM | POA: Diagnosis present

## 2024-10-04 DIAGNOSIS — E872 Acidosis, unspecified: Secondary | ICD-10-CM | POA: Diagnosis present

## 2024-10-04 LAB — COMPREHENSIVE METABOLIC PANEL WITH GFR
ALT: 50 U/L — ABNORMAL HIGH (ref 0–44)
AST: 31 U/L (ref 15–41)
Albumin: 4.2 g/dL (ref 3.5–5.0)
Alkaline Phosphatase: 189 U/L — ABNORMAL HIGH (ref 38–126)
Anion gap: 13 (ref 5–15)
BUN: 13 mg/dL (ref 6–20)
CO2: 23 mmol/L (ref 22–32)
Calcium: 9.7 mg/dL (ref 8.9–10.3)
Chloride: 100 mmol/L (ref 98–111)
Creatinine, Ser: 0.84 mg/dL (ref 0.44–1.00)
GFR, Estimated: >60 mL/min (ref >60)
Glucose, Bld: 358 mg/dL — ABNORMAL HIGH (ref 70–99)
Potassium: 4.2 mmol/L (ref 3.5–5.1)
Sodium: 135 mmol/L (ref 135–145)
Total Bilirubin: 1.5 mg/dL — ABNORMAL HIGH (ref 0.0–1.2)
Total Protein: 7.6 g/dL (ref 6.5–8.1)

## 2024-10-04 LAB — CBC
HCT: 32.8 % — ABNORMAL LOW (ref 36.0–46.0)
Hemoglobin: 10.4 g/dL — ABNORMAL LOW (ref 12.0–15.0)
MCH: 26.5 pg (ref 26.0–34.0)
MCHC: 31.7 g/dL (ref 30.0–36.0)
MCV: 83.5 fL (ref 80.0–100.0)
Platelets: 274 K/uL (ref 150–400)
RBC: 3.93 MIL/uL (ref 3.87–5.11)
RDW: 16.1 % — ABNORMAL HIGH (ref 11.5–15.5)
WBC: 11.3 K/uL — ABNORMAL HIGH (ref 4.0–10.5)
nRBC: 0 % (ref 0.0–0.2)

## 2024-10-04 LAB — URINALYSIS, ROUTINE W REFLEX MICROSCOPIC
Bacteria, UA: NONE SEEN
Bilirubin Urine: NEGATIVE
Glucose, UA: 250 mg/dL — AB
Ketones, ur: 15 mg/dL — AB
Nitrite: NEGATIVE
Protein, ur: >300 mg/dL — AB
RBC / HPF: >50 RBC/hpf (ref 0–5)
Specific Gravity, Urine: 1.036 — ABNORMAL HIGH (ref 1.005–1.030)
WBC, UA: >50 WBC/hpf (ref 0–5)
pH: 6 (ref 5.0–8.0)

## 2024-10-04 LAB — LIPASE, BLOOD: Lipase: 30 U/L (ref 11–51)

## 2024-10-04 MED ORDER — SODIUM CHLORIDE 0.9 % IV SOLN
1.0000 g | Freq: Once | INTRAVENOUS | Status: AC
  Administered 2024-10-04: 1 g via INTRAVENOUS
  Filled 2024-10-04: qty 10

## 2024-10-04 MED ORDER — METOCLOPRAMIDE HCL 5 MG/ML IJ SOLN
10.0000 mg | Freq: Once | INTRAMUSCULAR | Status: AC
  Administered 2024-10-04: 10 mg via INTRAVENOUS
  Filled 2024-10-04: qty 2

## 2024-10-04 MED ORDER — MORPHINE SULFATE (PF) 4 MG/ML IV SOLN
4.0000 mg | Freq: Once | INTRAVENOUS | Status: AC
  Administered 2024-10-04: 4 mg via INTRAVENOUS
  Filled 2024-10-04: qty 1

## 2024-10-04 MED ORDER — ONDANSETRON HCL 4 MG/2ML IJ SOLN
4.0000 mg | Freq: Once | INTRAMUSCULAR | Status: AC
  Administered 2024-10-04: 4 mg via INTRAVENOUS
  Filled 2024-10-04: qty 2

## 2024-10-04 MED ORDER — LACTATED RINGERS IV BOLUS
1000.0000 mL | Freq: Once | INTRAVENOUS | Status: AC
  Administered 2024-10-04: 1000 mL via INTRAVENOUS

## 2024-10-04 MED ORDER — IOHEXOL 300 MG/ML  SOLN
100.0000 mL | Freq: Once | INTRAMUSCULAR | Status: AC | PRN
  Administered 2024-10-04: 100 mL via INTRAVENOUS

## 2024-10-04 MED ORDER — ONDANSETRON HCL 4 MG/2ML IJ SOLN
4.0000 mg | Freq: Once | INTRAMUSCULAR | Status: AC
  Administered 2024-10-04: 4 mg via INTRAMUSCULAR

## 2024-10-04 MED ORDER — KETOROLAC TROMETHAMINE 30 MG/ML IJ SOLN
30.0000 mg | Freq: Once | INTRAMUSCULAR | Status: AC
  Administered 2024-10-04: 30 mg via INTRAMUSCULAR

## 2024-10-04 MED ORDER — KETOROLAC TROMETHAMINE 30 MG/ML IJ SOLN: 30.0000 mg | Freq: Once | INTRAMUSCULAR | Status: DC

## 2024-10-04 MED ORDER — SODIUM CHLORIDE 0.9 % IV BOLUS
1000.0000 mL | Freq: Once | INTRAVENOUS | Status: AC
  Administered 2024-10-04: 1000 mL via INTRAVENOUS

## 2024-10-04 NOTE — ED Triage Notes (Signed)
 Pt has multiple complaints: Abd pain, hematuria, sob and dizziness. Pt sent here for further eval by her PCP

## 2024-10-04 NOTE — Patient Instructions (Signed)
 It was wonderful to see you today.  Please bring ALL of your medications with you to every visit.   Today we talked about:  I am concerned about your abdominal pain. Please go to the ED   Thank you for choosing [facility].   Please call [phone] with any questions about today's appointment.  Please arrive at least 15 minutes prior to your scheduled appointments.   If you had blood work today, I will send you a MyChart message or a letter if results are normal. Otherwise, I will give you a call.   If you had a referral placed, they will call you to set up an appointment. Please give us  a call if you don't hear back in the next 2 weeks.   If you need additional refills before your next appointment, please call your pharmacy first.   Do you need your medications delivered to your home?   We'll send your prescription to the [facility] for delivery.          Address: [address] [place], [place], [place] [address]          Phone: [phone]  Please call the [facility] to speak with a pharmacist and set up your home medication delivery. If you have any questions, feel free to contact us  -- we're happy to help!  Other [facility] that offer affordable prices on both prescriptions and over-the-counter items, as well as convenient services like vaccinations, are  [facility], at [facility]         Address:  [address], [place], [place] [address]         Phone: [phone]  [facility], located in the [facility]        Address: [address] [place], [place] [address]        Phone: [phone]  [facility], at [facility]       Address: [address], [place], [place] [address]       Phone: [phone]  [facility], at [facility]       Address: [address], [place], [place] [address]       Phone: [phone]  You should follow up in our clinic in No follow-ups on file.  Gloriann Ogren, MD Family Medicine

## 2024-10-04 NOTE — ED Notes (Signed)
Pt seen walking outside  

## 2024-10-04 NOTE — ED Provider Notes (Signed)
 [facility] EMERGENCY DEPARTMENT AT [facility] Provider Note   CSN: [id number] Arrival date & time: 10/04/24  1455     Patient presents with: Abdominal Pain   Jasmin Collins is a 58 y.o. female with PMHx DM, GERD who presents ED concern for lower abdominal pain with nausea and vomiting that started earlier this morning around 10 AM.  Patient initially went to PCP office who referred her to ED.  PCP provided patient with Toradol  which did not provide much relief.  Patient stating that abdominal pain is intermittent and fluctuates in severity.  Denies fever, hematemesis, dysuria, hematochezia, diarrhea.  Denies vaginal discharge or concern for STI.  PCP note mentioning SOB. Patient stating that she has a hx of bronchitis, but the SOB today is more related to her not being able to take a full deep breath in d/t the pain in her lower abdomen.     Abdominal Pain      Prior to Admission medications   Medication Sig Start Date End Date Taking? Authorizing Provider  albuterol  (VENTOLIN  HFA) 108 (90 Base) MCG/ACT inhaler Inhale 2 puffs into the lungs every 6 (six) hours as needed for wheezing or shortness of breath. 09/04/24   Blair, Diane W, FNP  atorvastatin  (LIPITOR) 40 MG tablet Take 1 tablet (40 mg total) by mouth daily. 12/21/23   Gomes, Adriana, DO  Buprenorphine  HCl (BELBUCA ) 600 MCG FILM Place 1 Film (600 mcg) inside cheek 2 (two) times daily. 04/22/24     citalopram  (CELEXA ) 40 MG tablet TAKE 1 TABLET BY MOUTH EVERY DAY 07/08/24   Janna Ferrier, DO  COVID-19 Antibody Test KIT Use as directed on package instructions 08/11/24   Vivienne Delon HERO, PA-C  Dulaglutide  (TRULICITY ) 3 MG/0.5ML SOAJ INJECT 3 MG AS DIRECTED ONCE A WEEK. 10/04/24   Janna Ferrier, DO  hydrOXYzine  (ATARAX ) 50 MG tablet TAKE 1-2 TABLETS EVERY 6HRS AS NEEDED FOR ANXIETY 09/23/24   Gomes, Adriana, DO  ibuprofen  (ADVIL ) 600 MG tablet Take 1 tablet (600 mg total) by mouth every 8 (eight) hours as needed.  08/11/24   Vivienne Delon HERO, PA-C  oxyCODONE -acetaminophen  (PERCOCET) 10-325 MG tablet Take 1 tablet by mouth 4 (four) times daily as needed. 10/23/23     oxyCODONE -acetaminophen  (PERCOCET) 10-325 MG tablet Take 1 tablet by mouth 4 (four) times daily as needed. 11/26/23     oxyCODONE -acetaminophen  (PERCOCET) 10-325 MG tablet Take 1 tablet by mouth 4 (four) times daily as needed. 08/11/24     oxyCODONE -acetaminophen  (PERCOCET) 10-325 MG tablet Take 1 tablet by mouth 4 (four) times daily as needed. 09/26/24     pantoprazole  (PROTONIX ) 40 MG tablet TAKE 1 TABLET(40 MG) BY MOUTH DAILY 04/13/24   Janna Ferrier, DO  PEG 3350-KCl-NaCl-NaSulf-MgSul (SUFLAVE) 178.7 g SOLR Take as directed. 09/27/24     promethazine -dextromethorphan (PROMETHAZINE -DM) 6.25-15 MG/5ML syrup Take 5 mLs by mouth 4 (four) times daily as needed. 09/27/24   Gomes, Adriana, DO    Allergies: Patient has no known allergies.    Review of Systems  Gastrointestinal:  Positive for abdominal pain.    Updated Vital Signs BP 129/70   Pulse 99   Temp 98.6 F (37 C) (Oral)   Resp 19   LMP  (LMP Unknown)   SpO2 98%   Physical Exam Vitals and nursing note reviewed.  Constitutional:      General: She is not in acute distress.    Appearance: She is not ill-appearing or toxic-appearing.  HENT:     Head: Normocephalic  and atraumatic.     Mouth/Throat:     Mouth: Mucous membranes are moist.     Pharynx: No posterior oropharyngeal erythema.  Eyes:     General: No scleral icterus.       Right eye: No discharge.        Left eye: No discharge.     Conjunctiva/sclera: Conjunctivae normal.  Cardiovascular:     Rate and Rhythm: Normal rate and regular rhythm.     Pulses: Normal pulses.     Heart sounds: Normal heart sounds. No murmur heard. Pulmonary:     Effort: Pulmonary effort is normal. No respiratory distress.     Breath sounds: Normal breath sounds. No wheezing, rhonchi or rales.  Abdominal:     General: Abdomen is flat.  Bowel sounds are normal. There is no distension.     Palpations: Abdomen is soft. There is no mass.     Tenderness: There is abdominal tenderness in the right lower quadrant, suprapubic area and left lower quadrant. There is no right CVA tenderness or left CVA tenderness.  Musculoskeletal:     Right lower leg: No edema.     Left lower leg: No edema.  Skin:    General: Skin is warm and dry.     Findings: No rash.  Neurological:     General: No focal deficit present.     Mental Status: She is alert and oriented to person, place, and time. Mental status is at baseline.  Psychiatric:        Mood and Affect: Mood normal.        Behavior: Behavior normal.     (all labs ordered are listed, but only abnormal results are displayed) Labs Reviewed  COMPREHENSIVE METABOLIC PANEL WITH GFR - Abnormal; Notable for the following components:      Result Value   Glucose, Bld 358 (*)    ALT 50 (*)    Alkaline Phosphatase 189 (*)    Total Bilirubin 1.5 (*)    All other components within normal limits  CBC - Abnormal; Notable for the following components:   WBC 11.3 (*)    Hemoglobin 10.4 (*)    HCT 32.8 (*)    RDW 16.1 (*)    All other components within normal limits  URINALYSIS, ROUTINE W REFLEX MICROSCOPIC - Abnormal; Notable for the following components:   Color, Urine BROWN (*)    APPearance CLOUDY (*)    Specific Gravity, Urine 1.036 (*)    Glucose, UA 250 (*)    Hgb urine dipstick LARGE (*)    Ketones, ur 15 (*)    Protein, ur >300 (*)    Leukocytes,Ua LARGE (*)    All other components within normal limits  CULTURE, BLOOD (ROUTINE X 2)  CULTURE, BLOOD (ROUTINE X 2)  LIPASE, BLOOD    EKG: None  Radiology: CT ABDOMEN PELVIS W CONTRAST Result Date: 10/04/2024 EXAM: CT ABDOMEN AND PELVIS WITH CONTRAST 10/04/2024 06:35:51 PM TECHNIQUE: CT of the abdomen and pelvis was performed with the administration of 100 mL of iohexol  (OMNIPAQUE ) 300 MG/ML solution. Multiplanar reformatted  images are provided for review. Automated exposure control, iterative reconstruction, and/or weight-based adjustment of the mA/kV was utilized to reduce the radiation dose to as low as reasonably achievable. COMPARISON: None available. CLINICAL HISTORY: Abdominal pain, acute, nonlocalized. FINDINGS: LOWER CHEST: Moderate hiatal hernia. LIVER: Hepatic steatosis. Subcentimeter hypodensities within the inferior right hepatic lobe too small to further characterize. GALLBLADDER AND BILE DUCTS: Gallbladder is unremarkable. No  biliary ductal dilatation. SPLEEN: No acute abnormality. PANCREAS: No acute abnormality. ADRENAL GLANDS: No acute abnormality. KIDNEYS, URETERS AND BLADDER: Mild left hydronephrosis and proximal hydroureter secondary to a 6 x 14 mm stone in the proximal left ureter slightly distal to the UPJ. Moderate left perinephric fat stranding. Punctate intrarenal calculus in the lower pole of the left kidney. Urinary bladder is unremarkable. GI AND BOWEL: Stomach demonstrates no acute abnormality. There is no bowel obstruction. PERITONEUM AND RETROPERITONEUM: No ascites. No free air. VASCULATURE: Aorta is normal in caliber. LYMPH NODES: No lymphadenopathy. REPRODUCTIVE ORGANS: Uterine masses consistent with fibroids. At least two masses project over the endometrial stripe, the larger measuring 2 cm. Series 5, image 75. These are indeterminate for submucosal masses or endometrial masses. BONES AND SOFT TISSUES: No acute osseous abnormality. No focal soft tissue abnormality. IMPRESSION: 1. Mild left hydronephrosis and proximal hydroureter due to a 6 x 14 mm proximal left ureteral stone slightly distal to the UPJ. Moderate left perinephric fat stranding. Additional punctate left lower pole intrarenal calculus. 2. Moderate hiatal hernia. 3. Uterine fibroids with at least two masses projecting over the endometrial stripe, largest 2 cm; recommend pelvic ultrasound to assess for submucosal versus endometrial origin.  his may be performed non-emergently unless otherwise clinically indicated. Electronically signed by: Luke Bun MD 10/04/2024 06:43 PM EST RP Workstation: [id number]     .Critical Care  Performed by: Hoy Nidia FALCON, PA-C Authorized by: Hoy Nidia FALCON, PA-C   Critical care provider statement:    Critical care time (minutes):  30   Critical care was time spent personally by me on the following activities:  Development of treatment plan with patient or surrogate, discussions with consultants, evaluation of patient's response to treatment, examination of patient, ordering and review of laboratory studies, ordering and review of radiographic studies, ordering and performing treatments and interventions, pulse oximetry, re-evaluation of patient's condition and review of old charts   Care discussed with: admitting provider   Comments:     Infected stone    Medications Ordered in the ED  cefTRIAXone  (ROCEPHIN ) 1 g in sodium chloride  0.9 % 100 mL IVPB (has no administration in time range)  lactated ringers bolus 1,000 mL (has no administration in time range)  morphine (PF) 4 MG/ML injection 4 mg (4 mg Intravenous Given 10/04/24 1729)  ondansetron  (ZOFRAN ) injection 4 mg (4 mg Intravenous Given 10/04/24 1729)  cefTRIAXone  (ROCEPHIN ) 1 g in sodium chloride  0.9 % 100 mL IVPB (0 g Intravenous Stopped 10/04/24 1750)  sodium chloride  0.9 % bolus 1,000 mL (1,000 mLs Intravenous New Bag/Given 10/04/24 1734)  iohexol  (OMNIPAQUE ) 300 MG/ML solution 100 mL (100 mLs Intravenous Contrast Given 10/04/24 1828)  morphine (PF) 4 MG/ML injection 4 mg (4 mg Intravenous Given 10/04/24 1858)  ondansetron  (ZOFRAN ) injection 4 mg (4 mg Intravenous Given 10/04/24 1857)                                    Medical Decision Making Amount and/or Complexity of Data Reviewed Labs: ordered. Radiology: ordered.  Risk Prescription drug management.    This patient presents to the ED for concern of abdominal  pain, this involves an extensive number of treatment options, and is a complaint that carries with it a high risk of complications and morbidity.  The differential diagnosis includes gastroenteritis, colitis, small bowel obstruction, appendicitis, cholecystitis, pancreatitis, nephrolithiasis, UTI, pyleonephritis, ruptured ectopic pregnancy, PID, ovarian torsion.  Co morbidities that complicate the patient evaluation  DM, GERD   Additional history obtained:  Additional history obtained from PCP note earlier today   Problem List / ED Course / Critical interventions / Medication management  Patient presented for lower abdominal pain that started earlier today.  Physical exam with lower abdominal tenderness to palpation.  Pain is not unilateral.  Rest of physical exam reassuring.  Patient initially mildly tachycardic at 106 with resolved with IV fluids. I Ordered, and personally interpreted labs.  CBC with leukocytosis 11.3.  There is also mild anemia with hemoglobin at 10.4.  CMP with mild elevations in ALT/ALP at 50/189.  T. bili mildly elevated at 1.5.  Lipase within normal limits.  UA concerning for infection with large leukocytes, 50 WBC, and large hemoglobin. I ordered imaging studies including CT Abd/Pelvis with contrast: evaluate for structural/surgical etiology of patients' severe abdominal pain. I independently visualized and interpreted imaging and I agree with the radiologist interpretation of 6x today 14 mm left ureteral stone and dizziness and moderate left perinephric fat stranding.  additional punctate left lower pole intrarenal calculus. I personally viewed and interpreted the EKG/cardiac monitored which showed an underlying rhythm of: Sinus rhythm I have reviewed the patients home medicines and have made adjustments as needed   Social Determinants of Health:  none  7PM Care of Jasmin Collins transferred to [employer] at the end of my shift as the patient will require  reassessment once labs/imaging have resulted. Patient presentation, ED course, and plan of care discussed with review of all pertinent labs and imaging. Please see his/her note for further details regarding further ED course and disposition. Plan at time of handoff is reassess patient after urology consultation. This may be altered or completely changed at the discretion of the oncoming team pending results of further workup.      Final diagnoses:  Kidney stone on left side    ED Discharge Orders     None          Hoy Nidia FALCON, [place] 10/04/24 1900    Dean Clarity, MD 10/04/24 2308

## 2024-10-04 NOTE — ED Provider Notes (Signed)
 Patient signed out to myself from [name], [facility] pending urology consult and likely admission for pain and nausea and vomiting control. In short patient presenting today for severe abdominal pain, found to have large proximal left ureteral stone on CT.   Consulted urology, Dr. Sherrilee who stated that he did not feel this was a septic stone and if patient's pain and nausea/vomiting was well-controlled patient could be seen outpatient or if intractable pain and nausea/vomiting admission.  Consulted Hospitalist, Dr. Marcene who is agreeable to admission   Jasmin Collins 10/04/24 2057    Dean Clarity, MD 10/04/24 [phone]

## 2024-10-04 NOTE — Progress Notes (Signed)
 Hospitalist Transfer Note:    Nursing staff, Please call [facility] Admits & Consults System-Wide number on Amion [phone]) as soon as patient's arrival, so appropriate admitting provider can evaluate the pt.   Transferring facility: [facility] Requesting provider: Ileana Eck, PA (EDP at [facility]) Reason for transfer: admission for further evaluation and management of obstructing left ureteral stone complicated by uti.    58 year old female with history of diabetes, who presented to [facility] ED complaining of left-sided abdominal discomfort radiating into the left flank and associated with intermittent nausea/vomiting.  Denies any associated fever.  Medical history notable for type 2 diabetes mellitus.  Vital signs in the ED were notable for the following: Temperature max 99.3; heart rates in the low 100s to 120s; systolic blood pressures in the 1 teens to 1 1050s, with most recent blood pressure noted to be 126/68, respiratory rate 19-22, and oxygen saturation 96 to 100% on room air.  Labs were notable for CMP showing creatinine 0.84.  CBC notable for white blood cell count 11,300.  Urinalysis demonstrated greater than 50 white blood cells, large leukocyte esterase, adenosquamous epithelial cells.  Blood cultures x 2 and urine culture were collected prior to initiation of IV Rocephin .  Imaging notable for CT abdomen/pelvis with contrast, which showed mild left hydronephrosis and proximal hydroureter due to a 6 x 14 mm proximal left ureteral stone, slightly distal to the UPJ, also demonstrating moderate left perinephric stranding.   EDP d/w on-call urology, Dr. Sherrilee, who conveyed that urology will consult at [facility]   Subsequently, I accepted this patient for transfer for observation to a pcu bed at [facility] for further work-up and management of the above.     Eva Pore, DO Hospitalist

## 2024-10-04 NOTE — Progress Notes (Signed)
 I discussed this patient with the resident physician. I have reviewed resident's note and I agree with their findings and treatment plan.  Jasmin Collins was writhing of exam table, difficulty speaking initially. Complaining of bilateral abdominal pain and gross hematuria    PMH: ureteral stone 07/25/2006  VS stable, Afeb Patient in distress Coughing then retching, scant red blood mixed in sputum/saliva.  Abdomen: TTP suprapubic, no rebound, no guarding, (+) BS No CVAT  A/ Confusing picture of pulmonary and GI/GU symptoms/signs Patient in distress  P/ Recommend urgent visit to ED for diagnostic evaluation unavailable in our office.   Patient given Troadol 30 mg intramuscular and Zofran  ODT 4 mg tab x 11. Friend picked up patient from exam room to transport to ED. Patient was stable for transport in non-emergently by friend.

## 2024-10-04 NOTE — Progress Notes (Signed)
 t   SUBJECTIVE:   CHIEF COMPLAINT / HPI:   Patient presents for severe suprapubic abdominal pain that started this morning.  She reports that she has been feeling sick, dizzy, nauseous, with cough and shortness of breath for a few days.  Patient also endorses gross hematuria.   OBJECTIVE:   BP (!) 149/69   Pulse 97   LMP  (LMP Unknown)   SpO2 99%   General: Writhing in pain on exam table, nauseous, coughing with retching Resp: crackles at left lung base, moving air bilaterally Abdomen: Tender to palpation at suprapubic area, no rebound or guarding.   ASSESSMENT/PLAN:   Assessment & Plan Abdominal pain, unspecified abdominal location Patient presented with acute onset of abdominal pain along with dizziness, hematuria, ongoing cough.  Given patient in distress along with GI/GU symptoms as well as coughing and shortness of breath, recommend urgent visit to ED for diagnostic evaluation.  Patient was given option of ambulance transportation, but opted to travel by personal vehicle.  Patient stable for transport.  Patient received Toradol  30 mg IM and Zofran  IM 4 mg prior to departure.   Gloriann Ogren, MD [facility]

## 2024-10-04 NOTE — ED Triage Notes (Signed)
 Pt bib [facility] with c/o abd pain with nausea, possible PNA. PT LWBS from [facility]. 146/82 110HR 99 % 28 RR

## 2024-10-05 ENCOUNTER — Inpatient Hospital Stay (HOSPITAL_COMMUNITY)

## 2024-10-05 ENCOUNTER — Encounter (HOSPITAL_COMMUNITY)
Admission: EM | Disposition: A | Discharge status: Home / Self Care | Payer: Self-pay | Source: Home / Self Care | Attending: Internal Medicine

## 2024-10-05 ENCOUNTER — Observation Stay (HOSPITAL_COMMUNITY): Admitting: Anesthesiology

## 2024-10-05 ENCOUNTER — Encounter (HOSPITAL_COMMUNITY): Payer: Self-pay | Admitting: Family Medicine

## 2024-10-05 DIAGNOSIS — Z79899 Other long term (current) drug therapy: Secondary | ICD-10-CM | POA: Diagnosis not present

## 2024-10-05 DIAGNOSIS — D649 Anemia, unspecified: Secondary | ICD-10-CM | POA: Diagnosis present

## 2024-10-05 DIAGNOSIS — D6489 Other specified anemias: Secondary | ICD-10-CM | POA: Diagnosis not present

## 2024-10-05 DIAGNOSIS — E66813 Obesity, class 3: Secondary | ICD-10-CM

## 2024-10-05 DIAGNOSIS — K219 Gastro-esophageal reflux disease without esophagitis: Secondary | ICD-10-CM | POA: Diagnosis present

## 2024-10-05 DIAGNOSIS — N201 Calculus of ureter: Secondary | ICD-10-CM

## 2024-10-05 DIAGNOSIS — N179 Acute kidney failure, unspecified: Secondary | ICD-10-CM

## 2024-10-05 DIAGNOSIS — G8929 Other chronic pain: Secondary | ICD-10-CM | POA: Insufficient documentation

## 2024-10-05 DIAGNOSIS — N136 Pyonephrosis: Secondary | ICD-10-CM | POA: Diagnosis present

## 2024-10-05 DIAGNOSIS — Z8249 Family history of ischemic heart disease and other diseases of the circulatory system: Secondary | ICD-10-CM | POA: Diagnosis not present

## 2024-10-05 DIAGNOSIS — F419 Anxiety disorder, unspecified: Secondary | ICD-10-CM

## 2024-10-05 DIAGNOSIS — N200 Calculus of kidney: Secondary | ICD-10-CM

## 2024-10-05 DIAGNOSIS — E872 Acidosis, unspecified: Secondary | ICD-10-CM | POA: Diagnosis present

## 2024-10-05 DIAGNOSIS — E119 Type 2 diabetes mellitus without complications: Secondary | ICD-10-CM | POA: Diagnosis present

## 2024-10-05 DIAGNOSIS — Z466 Encounter for fitting and adjustment of urinary device: Secondary | ICD-10-CM

## 2024-10-05 DIAGNOSIS — R310 Gross hematuria: Secondary | ICD-10-CM | POA: Diagnosis present

## 2024-10-05 DIAGNOSIS — N111 Chronic obstructive pyelonephritis: Secondary | ICD-10-CM | POA: Diagnosis not present

## 2024-10-05 DIAGNOSIS — E860 Dehydration: Secondary | ICD-10-CM | POA: Diagnosis present

## 2024-10-05 DIAGNOSIS — A419 Sepsis, unspecified organism: Secondary | ICD-10-CM | POA: Diagnosis present

## 2024-10-05 DIAGNOSIS — Z6841 Body Mass Index (BMI) 40.0 and over, adult: Secondary | ICD-10-CM | POA: Diagnosis not present

## 2024-10-05 DIAGNOSIS — N390 Urinary tract infection, site not specified: Secondary | ICD-10-CM | POA: Diagnosis present

## 2024-10-05 HISTORY — PX: CYSTOSCOPY W/ URETERAL STENT PLACEMENT: SHX1429

## 2024-10-05 LAB — BASIC METABOLIC PANEL WITH GFR
Anion gap: 10 (ref 5–15)
BUN: 17 mg/dL (ref 6–20)
CO2: 23 mmol/L (ref 22–32)
Calcium: 8.5 mg/dL — ABNORMAL LOW (ref 8.9–10.3)
Chloride: 104 mmol/L (ref 98–111)
Creatinine, Ser: 1.45 mg/dL — ABNORMAL HIGH (ref 0.44–1.00)
GFR, Estimated: 42 mL/min — ABNORMAL LOW (ref >60)
Glucose, Bld: 367 mg/dL — ABNORMAL HIGH (ref 70–99)
Potassium: 4.2 mmol/L (ref 3.5–5.1)
Sodium: 137 mmol/L (ref 135–145)

## 2024-10-05 LAB — HEPATIC FUNCTION PANEL
ALT: 42 U/L (ref 0–44)
AST: 44 U/L — ABNORMAL HIGH (ref 15–41)
Albumin: 3.1 g/dL — ABNORMAL LOW (ref 3.5–5.0)
Alkaline Phosphatase: 128 U/L — ABNORMAL HIGH (ref 38–126)
Bilirubin, Direct: 0.5 mg/dL — ABNORMAL HIGH (ref 0.0–0.2)
Indirect Bilirubin: 0.6 mg/dL (ref 0.3–0.9)
Total Bilirubin: 1.2 mg/dL (ref 0.0–1.2)
Total Protein: 6 g/dL — ABNORMAL LOW (ref 6.5–8.1)

## 2024-10-05 LAB — HEMOGLOBIN A1C
Hgb A1c MFr Bld: 8.1 % — ABNORMAL HIGH (ref 4.8–5.6)
Mean Plasma Glucose: 185.77 mg/dL

## 2024-10-05 LAB — CBC
HCT: 27.1 % — ABNORMAL LOW (ref 36.0–46.0)
Hemoglobin: 8.4 g/dL — ABNORMAL LOW (ref 12.0–15.0)
MCH: 26.8 pg (ref 26.0–34.0)
MCHC: 31 g/dL (ref 30.0–36.0)
MCV: 86.6 fL (ref 80.0–100.0)
Platelets: 190 K/uL (ref 150–400)
RBC: 3.13 MIL/uL — ABNORMAL LOW (ref 3.87–5.11)
RDW: 16.9 % — ABNORMAL HIGH (ref 11.5–15.5)
WBC: 17.8 K/uL — ABNORMAL HIGH (ref 4.0–10.5)
nRBC: 0.4 % — ABNORMAL HIGH (ref 0.0–0.2)

## 2024-10-05 LAB — GLUCOSE, CAPILLARY
Glucose-Capillary: 322 mg/dL — ABNORMAL HIGH (ref 70–99)
Glucose-Capillary: 286 mg/dL — ABNORMAL HIGH (ref 70–99)
Glucose-Capillary: 170 mg/dL — ABNORMAL HIGH (ref 70–99)
Glucose-Capillary: 350 mg/dL — ABNORMAL HIGH (ref 70–99)
Glucose-Capillary: 399 mg/dL — ABNORMAL HIGH (ref 70–99)

## 2024-10-05 LAB — APTT: aPTT: 28 s (ref 24–36)

## 2024-10-05 LAB — PROTIME-INR
INR: 1.2 (ref 0.8–1.2)
Prothrombin Time: 16 s — ABNORMAL HIGH (ref 11.4–15.2)

## 2024-10-05 LAB — LACTIC ACID, PLASMA
Lactic Acid, Venous: 3.9 mmol/L (ref 0.5–1.9)
Lactic Acid, Venous: 3.9 mmol/L (ref 0.5–1.9)

## 2024-10-05 LAB — SURGICAL PCR SCREEN
MRSA, PCR: POSITIVE — AB
Staphylococcus aureus: POSITIVE — AB

## 2024-10-05 LAB — HIV ANTIBODY (ROUTINE TESTING W REFLEX): HIV Screen 4th Generation wRfx: NONREACTIVE

## 2024-10-05 SURGERY — CYSTOSCOPY, WITH RETROGRADE PYELOGRAM AND URETERAL STENT INSERTION
Anesthesia: General | Site: Ureter | Laterality: Left

## 2024-10-05 MED ORDER — PROPOFOL 10 MG/ML IV BOLUS
INTRAVENOUS | Status: DC | PRN
  Administered 2024-10-05: 200 mg via INTRAVENOUS

## 2024-10-05 MED ORDER — CHLORHEXIDINE GLUCONATE CLOTH 2 % EX PADS
6.0000 | MEDICATED_PAD | Freq: Every day | CUTANEOUS | Status: DC
  Administered 2024-10-05: 6 via TOPICAL

## 2024-10-05 MED ORDER — SODIUM CHLORIDE 0.9% FLUSH
3.0000 mL | Freq: Two times a day (BID) | INTRAVENOUS | Status: DC
  Administered 2024-10-05 – 2024-10-06 (×4): 3 mL via INTRAVENOUS

## 2024-10-05 MED ORDER — CHLORHEXIDINE GLUCONATE 0.12 % MT SOLN
15.0000 mL | Freq: Once | OROMUCOSAL | Status: AC
  Administered 2024-10-05: 15 mL via OROMUCOSAL

## 2024-10-05 MED ORDER — INSULIN ASPART 100 UNIT/ML IJ SOLN
0.0000 [IU] | Freq: Every day | INTRAMUSCULAR | Status: DC
  Administered 2024-10-05: 5 [IU] via SUBCUTANEOUS
  Filled 2024-10-05: qty 5

## 2024-10-05 MED ORDER — PANTOPRAZOLE SODIUM 40 MG PO TBEC
40.0000 mg | DELAYED_RELEASE_TABLET | Freq: Every day | ORAL | Status: DC
  Administered 2024-10-05 – 2024-10-06 (×2): 40 mg via ORAL
  Filled 2024-10-05 (×2): qty 1

## 2024-10-05 MED ORDER — ONDANSETRON HCL 4 MG PO TABS
4.0000 mg | ORAL_TABLET | Freq: Four times a day (QID) | ORAL | Status: DC | PRN
Start: 2024-10-05 — End: 2024-10-06

## 2024-10-05 MED ORDER — HYDROMORPHONE HCL 1 MG/ML IJ SOLN
0.5000 mg | INTRAMUSCULAR | Status: DC | PRN
  Administered 2024-10-05 (×2): 0.5 mg via INTRAVENOUS
  Filled 2024-10-05 (×2): qty 0.5

## 2024-10-05 MED ORDER — ONDANSETRON HCL 4 MG/2ML IJ SOLN
4.0000 mg | Freq: Four times a day (QID) | INTRAMUSCULAR | Status: DC | PRN
  Administered 2024-10-05: 4 mg via INTRAVENOUS

## 2024-10-05 MED ORDER — LACTATED RINGERS IV SOLN
150.0000 mL/h | INTRAVENOUS | Status: AC
  Administered 2024-10-05: 150 mL/h via INTRAVENOUS

## 2024-10-05 MED ORDER — OXYCODONE HCL 5 MG/5ML PO SOLN: 5.0000 mg | Freq: Once | ORAL | Status: DC | PRN

## 2024-10-05 MED ORDER — ACETAMINOPHEN 325 MG PO TABS
650.0000 mg | ORAL_TABLET | Freq: Once | ORAL | Status: AC | PRN
  Administered 2024-10-05: 650 mg via ORAL
  Filled 2024-10-05: qty 2

## 2024-10-05 MED ORDER — ACETAMINOPHEN 325 MG PO TABS: 650.0000 mg | ORAL_TABLET | Freq: Four times a day (QID) | ORAL | Status: DC | PRN

## 2024-10-05 MED ORDER — LACTATED RINGERS IV SOLN: INTRAVENOUS | Status: DC

## 2024-10-05 MED ORDER — FENTANYL CITRATE (PF) 50 MCG/ML IJ SOSY: 25.0000 ug | PREFILLED_SYRINGE | INTRAMUSCULAR | Status: DC | PRN

## 2024-10-05 MED ORDER — MUPIROCIN 2 % EX OINT
1.0000 | TOPICAL_OINTMENT | Freq: Two times a day (BID) | CUTANEOUS | Status: DC
  Administered 2024-10-05 – 2024-10-06 (×2): 1 via NASAL
  Filled 2024-10-05: qty 22

## 2024-10-05 MED ORDER — FENTANYL CITRATE (PF) 100 MCG/2ML IJ SOLN
INTRAMUSCULAR | Status: AC
  Filled 2024-10-05: qty 2

## 2024-10-05 MED ORDER — AMISULPRIDE (ANTIEMETIC) 5 MG/2ML IV SOLN: 10.0000 mg | Freq: Once | INTRAVENOUS | Status: DC | PRN

## 2024-10-05 MED ORDER — PROPOFOL 10 MG/ML IV BOLUS
INTRAVENOUS | Status: AC
  Filled 2024-10-05: qty 20

## 2024-10-05 MED ORDER — ACETAMINOPHEN 650 MG RE SUPP: 650.0000 mg | Freq: Four times a day (QID) | RECTAL | Status: DC | PRN

## 2024-10-05 MED ORDER — OXYCODONE-ACETAMINOPHEN 10-325 MG PO TABS: 1.0000 | ORAL_TABLET | Freq: Four times a day (QID) | ORAL | Status: DC | PRN

## 2024-10-05 MED ORDER — HYDROXYZINE HCL 50 MG PO TABS: 50.0000 mg | ORAL_TABLET | Freq: Four times a day (QID) | ORAL | Status: DC | PRN

## 2024-10-05 MED ORDER — ALBUTEROL SULFATE (2.5 MG/3ML) 0.083% IN NEBU
2.5000 mg | INHALATION_SOLUTION | Freq: Once | RESPIRATORY_TRACT | Status: AC
  Administered 2024-10-05: 2.5 mg via RESPIRATORY_TRACT
  Filled 2024-10-05: qty 3

## 2024-10-05 MED ORDER — ALBUTEROL SULFATE (2.5 MG/3ML) 0.083% IN NEBU: 2.5000 mg | INHALATION_SOLUTION | Freq: Four times a day (QID) | RESPIRATORY_TRACT | Status: DC | PRN

## 2024-10-05 MED ORDER — OXYCODONE HCL 5 MG PO TABS: 5.0000 mg | ORAL_TABLET | Freq: Four times a day (QID) | ORAL | Status: DC | PRN

## 2024-10-05 MED ORDER — CITALOPRAM HYDROBROMIDE 20 MG PO TABS
40.0000 mg | ORAL_TABLET | Freq: Every day | ORAL | Status: DC
  Administered 2024-10-05 – 2024-10-06 (×2): 40 mg via ORAL
  Filled 2024-10-05 (×2): qty 2

## 2024-10-05 MED ORDER — OXYCODONE-ACETAMINOPHEN 5-325 MG PO TABS
1.0000 | ORAL_TABLET | Freq: Four times a day (QID) | ORAL | Status: DC | PRN
  Administered 2024-10-05 – 2024-10-06 (×3): 1 via ORAL
  Filled 2024-10-05 (×3): qty 1

## 2024-10-05 MED ORDER — INSULIN ASPART 100 UNIT/ML IJ SOLN
0.0000 [IU] | Freq: Three times a day (TID) | INTRAMUSCULAR | Status: DC
  Administered 2024-10-06: 20 [IU] via SUBCUTANEOUS
  Filled 2024-10-05: qty 20

## 2024-10-05 MED ORDER — IOHEXOL 300 MG/ML  SOLN
INTRAMUSCULAR | Status: DC | PRN
  Administered 2024-10-05: 10 mL

## 2024-10-05 MED ORDER — SODIUM CHLORIDE 0.9 % IR SOLN
Status: DC | PRN
  Administered 2024-10-05: 1000 mL

## 2024-10-05 MED ORDER — DEXAMETHASONE SODIUM PHOSPHATE 4 MG/ML IJ SOLN
INTRAMUSCULAR | Status: DC | PRN
  Administered 2024-10-05: 4 mg via INTRAVENOUS

## 2024-10-05 MED ORDER — PHENYLEPHRINE HCL (PRESSORS) 10 MG/ML IV SOLN
INTRAVENOUS | Status: DC | PRN
  Administered 2024-10-05: 100 ug via INTRAVENOUS

## 2024-10-05 MED ORDER — ATORVASTATIN CALCIUM 40 MG PO TABS
40.0000 mg | ORAL_TABLET | Freq: Every day | ORAL | Status: DC
  Administered 2024-10-05 – 2024-10-06 (×2): 40 mg via ORAL
  Filled 2024-10-05 (×2): qty 1

## 2024-10-05 MED ORDER — SODIUM CHLORIDE 0.9 % IV SOLN
2.0000 g | INTRAVENOUS | Status: DC
  Administered 2024-10-05: 2 g via INTRAVENOUS
  Filled 2024-10-05: qty 20

## 2024-10-05 MED ORDER — INSULIN GLARGINE-YFGN 100 UNIT/ML ~~LOC~~ SOLN
15.0000 [IU] | Freq: Every day | SUBCUTANEOUS | Status: DC
  Administered 2024-10-05 – 2024-10-06 (×2): 15 [IU] via SUBCUTANEOUS
  Filled 2024-10-05 (×2): qty 0.15

## 2024-10-05 MED ORDER — ORAL CARE MOUTH RINSE
15.0000 mL | Freq: Once | OROMUCOSAL | Status: AC
Start: 2024-10-05 — End: 2024-10-05

## 2024-10-05 MED ORDER — FENTANYL CITRATE (PF) 100 MCG/2ML IJ SOLN
INTRAMUSCULAR | Status: DC | PRN
  Administered 2024-10-05: 100 ug via INTRAVENOUS

## 2024-10-05 MED ORDER — POLYETHYLENE GLYCOL 3350 17 G PO PACK: 17.0000 g | PACK | Freq: Every day | ORAL | Status: DC | PRN

## 2024-10-05 MED ORDER — OXYCODONE HCL 5 MG PO TABS: 5.0000 mg | ORAL_TABLET | Freq: Once | ORAL | Status: DC | PRN

## 2024-10-05 MED ORDER — INSULIN ASPART 100 UNIT/ML IJ SOLN
0.0000 [IU] | INTRAMUSCULAR | Status: DC
  Administered 2024-10-05: 3 [IU] via SUBCUTANEOUS
  Administered 2024-10-05: 1 [IU] via SUBCUTANEOUS
  Administered 2024-10-05 (×2): 4 [IU] via SUBCUTANEOUS
  Filled 2024-10-05: qty 4
  Filled 2024-10-05: qty 1
  Filled 2024-10-05 (×2): qty 4

## 2024-10-05 MED ORDER — LACTATED RINGERS IV BOLUS (SEPSIS)
1000.0000 mL | Freq: Once | INTRAVENOUS | Status: AC
  Administered 2024-10-05: 1000 mL via INTRAVENOUS

## 2024-10-05 MED ORDER — LIDOCAINE HCL (CARDIAC) PF 100 MG/5ML IV SOSY
PREFILLED_SYRINGE | INTRAVENOUS | Status: DC | PRN
  Administered 2024-10-05: 50 mg via INTRAVENOUS

## 2024-10-05 MED ORDER — SODIUM CHLORIDE 0.9 % IR SOLN
Status: DC | PRN
  Administered 2024-10-05: 3000 mL via INTRAVESICAL

## 2024-10-05 MED ORDER — STERILE WATER FOR INJECTION IJ SOLN
INTRAMUSCULAR | Status: AC
  Filled 2024-10-05: qty 10

## 2024-10-05 SURGICAL SUPPLY — 12 items
BAG URO CATCHER STRL LF (MISCELLANEOUS) ×1 IMPLANT
CATH URETL OPEN END 6FR 70 (CATHETERS) IMPLANT
CLOTH BEACON ORANGE TIMEOUT ST (SAFETY) ×1 IMPLANT
GLOVE SURG LX STRL 7.5 STRW (GLOVE) ×1 IMPLANT
GOWN STRL REUS W/ TWL XL LVL3 (GOWN DISPOSABLE) ×1 IMPLANT
GUIDEWIRE STR DUAL SENSOR (WIRE) ×1 IMPLANT
GUIDEWIRE ZIPWRE .038 STRAIGHT (WIRE) IMPLANT
KIT TURNOVER KIT A (KITS) ×1 IMPLANT
MANIFOLD NEPTUNE II (INSTRUMENTS) ×1 IMPLANT
MAT HALF PREVALON HALF STRYKER (MISCELLANEOUS) IMPLANT
PACK CYSTO (CUSTOM PROCEDURE TRAY) ×1 IMPLANT
TUBING CONNECTING 10 (TUBING) ×1 IMPLANT

## 2024-10-05 NOTE — Plan of Care (Incomplete)
  Problem: Education: Goal: Knowledge of General Education information will improve Description: Including pain rating scale, medication(s)/side effects and non-pharmacologic comfort measures Outcome: Progressing   Problem: Health Behavior/Discharge Planning: Goal: Ability to manage health-related needs will improve Outcome: Progressing   Problem: Clinical Measurements: Goal: Ability to maintain clinical measurements within normal limits will improve Outcome: Progressing Goal: Will remain free from infection Outcome: Progressing Goal: Diagnostic test results will improve Outcome: Progressing Goal: Respiratory complications will improve Outcome: Progressing Goal: Cardiovascular complication will be avoided Outcome: Progressing   Problem: Activity: Goal: Risk for activity intolerance will decrease Outcome: Progressing   Problem: Coping: Goal: Level of anxiety will decrease Outcome: Progressing   Problem: Nutrition: Goal: Adequate nutrition will be maintained Outcome: Adequate for Discharge

## 2024-10-05 NOTE — Anesthesia Procedure Notes (Signed)
 Procedure Name: LMA Insertion Date/Time: 10/05/2024 11:32 AM  Performed by: Levander Lani BIRCH, CRNAPre-anesthesia Checklist: Patient identified, Emergency Drugs available, Suction available, Patient being monitored and Timeout performed Patient Re-evaluated:Patient Re-evaluated prior to induction Oxygen Delivery Method: Circle system utilized Preoxygenation: Pre-oxygenation with 100% oxygen Induction Type: IV induction Ventilation: Mask ventilation without difficulty LMA: LMA inserted LMA Size: 4.0 Tube type: Oral Number of attempts: 1 Placement Confirmation: positive ETCO2 and breath sounds checked- equal and bilateral Secured at: 23 cm Tube secured with: Tape Dental Injury: Teeth and Oropharynx as per pre-operative assessment

## 2024-10-05 NOTE — Progress Notes (Signed)
 PROGRESS NOTE    Jasmin Collins  FMW:[id number] DOB: 09/12/1966 DOA: 10/04/2024 PCP: Janna Ferrier, DO  Subjective: Patient seen and examined.  Boyfriend at the bedside.  Patient underwent double-J stent placement and cystoscopy today.  She will follow-up with urology as an outpatient.  Left flank pain is now minimal.  Patient is hungry wants to eat.  Remains on IV antibiotics.   Hospital Course: CC: left sided abd pain, gross hematuria HPI:  Jasmin Collins is a 58 y.o. female with medical history significant for anxiety, type 2 diabetes mellitus, GERD, and chronic low back pain who presents with left-sided abdominal pain, nausea, vomiting, gross hematuria, and chills.   Patient reports that she was in her usual state last night but this morning developed severe left-sided abdominal pain.  She went on to develop nausea with vomiting and severe, shaking chills.  She also noted gross hematuria.  She said evaluation at her PCP office was directed to the ED for expedited workup and treatment.   [facility] ED Course: In the ED, patient is found to be febrile, tachypneic, and tachycardic with stable BP.  Labs are most notable for normal creatinine, glucose 358, WBC 11,300, and hemoglobin 10.4.  CT demonstrates mild left hydronephrosis and proximal left hydroureter due to 6 x 14 mm proximal left ureteral stone.  Also noted on CT is perinephric fat stranding on the left.   Urology (Dr. Sherrilee) was consulted by the ED PA, blood and urine cultures were collected, and the patient was given 2 L of IVF, Zofran , Reglan, 2 g IV Rocephin , morphine, and acetaminophen .  She was transferred to [facility] for admission.  Significant Events: Admitted 10/04/2024 for obstructive Left-sided pyelonephritis   Admission Labs: WBC 11.3, HgB 10.4, plt 274 UA brown and cloudy, spg 1.036, protein >300, negative LE, large LE, RBC >50, WBC >50 Na 135, K 4.2, CO2 of 23, BUN 13, Scr 0.84, glu  358 T prot 7.6, AST 31, ALT 50, alk phos 189, t. Bili 1.5 Lipase 30  Admission Imaging Studies: CT abd/pelvis Mild left hydronephrosis and proximal hydroureter due to a 6 x 14 mm proximal left ureteral stone slightly distal to the UPJ. Moderate left perinephric fat stranding. Additional punctate left lower pole intrarenal calculus. 2. Moderate hiatal hernia. 3. Uterine fibroids with at least two masses projecting over the endometrial stripe, largest 2 cm; recommend pelvic ultrasound to assess for submucosal versus endometrial origin. his may be performed non-emergently unless otherwise clinically indicated.  Significant Labs:   Significant Imaging Studies:   Antibiotic Therapy: Anti-infectives (From admission, onward)    Start     Dose/Rate Route Frequency Ordered Stop   10/05/24 1800  cefTRIAXone  (ROCEPHIN ) 2 g in sodium chloride  0.9 % 100 mL IVPB        2 g 200 mL/hr over 30 Minutes Intravenous Every 24 hours 10/05/24 0247 10/11/24 1759   10/04/24 1900  cefTRIAXone  (ROCEPHIN ) 1 g in sodium chloride  0.9 % 100 mL IVPB        1 g 200 mL/hr over 30 Minutes Intravenous  Once 10/04/24 1857 10/04/24 2042   10/04/24 1715  cefTRIAXone  (ROCEPHIN ) 1 g in sodium chloride  0.9 % 100 mL IVPB        1 g 200 mL/hr over 30 Minutes Intravenous  Once 10/04/24 1704 10/04/24 1750       Procedures:   Consultants: urology    Assessment and Plan: * Obstructive pyelonephritis 10/05/24 continue with IV antibiotics.  Repeat  labs in the morning.  Waiting cultures.   Sepsis due to urinary tract infection (HCC) 10/05/24 present on admission.  Evidenced by white count 11.3, lactic acid of 3.9, heart rate of 106, respirations of 30.  Sepsis due to UTI.  Has acute organ dysfunction with elevated lactic acid but without septic shock.   Encounter for fitting of ureteral stent - placed 10-05-2024 10/05/24 Status post double-J stent placement with cystoscopy today.  Patient will follow-up with Dr. Renda  in urology office.   AKI (acute kidney injury) 10/05/24 due to pyelonephritis and obstructive uropathy.  Continue IV fluids.  Repeat chemistry in the morning.   Obesity, Class III, BMI 40-49.9 (morbid obesity) (HCC) Body mass index is 40.97 kg/m.   Kidney stone 10/05/24 will need definitive stone management as an outpatient.   Type 2 diabetes mellitus without complication, without long-term current use of insulin (HCC) 10/05/24 continue with sliding scale insulin.   DVT prophylaxis: SCDs Start: 10/05/24 0246    Code Status: Full Code Family Communication: discussed at bedside with pt's boyfriend Darold cedar Disposition Plan: return home Reason for continuing need for hospitalization: remains on IVF and IV ABX.  Objective: Vitals:   10/05/24 1230 10/05/24 1245 10/05/24 1301 10/05/24 1443  BP: 126/69  (!) 141/79 135/70  Pulse: (!) 116 (!) 114 (!) 110 (!) 108  Resp: 20 20 16  (!) 24  Temp:   99.4 F (37.4 C) 98.5 F (36.9 C)  TempSrc:   Oral Oral  SpO2: 99% 97% 98% 100%  Weight:      Height:        Intake/Output Summary (Last 24 hours) at 10/05/2024 1644 Last data filed at 10/05/2024 1548 Gross per 24 hour  Intake 1041.67 ml  Output 1 ml  Net 1040.67 ml   Filed Weights   10/05/24 0332  Weight: 101.6 kg    Examination:  Physical Exam Vitals and nursing note reviewed.  Constitutional:      General: She is not in acute distress.    Appearance: She is obese. She is not ill-appearing, toxic-appearing or diaphoretic.  HENT:     Head: Normocephalic and atraumatic.  Cardiovascular:     Rate and Rhythm: Normal rate and regular rhythm.  Pulmonary:     Effort: Pulmonary effort is normal.     Breath sounds: Normal breath sounds.  Abdominal:     General: Abdomen is protuberant. Bowel sounds are normal.     Palpations: Abdomen is soft.  Skin:    General: Skin is warm and dry.     Capillary Refill: Capillary refill takes less than 2 seconds.  Neurological:      General: No focal deficit present.     Mental Status: She is alert and oriented to person, place, and time.     Data Reviewed: I have personally reviewed following labs and imaging studies  CBC: Recent Labs  Lab 10/04/24 1528 10/05/24 0420  WBC 11.3* 17.8*  HGB 10.4* 8.4*  HCT 32.8* 27.1*  MCV 83.5 86.6  PLT 274 190   Basic Metabolic Panel: Recent Labs  Lab 10/04/24 1528 10/05/24 0420  NA 135 137  K 4.2 4.2  CL 100 104  CO2 23 23  GLUCOSE 358* 367*  BUN 13 17  CREATININE 0.84 1.45*  CALCIUM  9.7 8.5*   GFR: Estimated Creatinine Clearance: 48.4 mL/min (A) (by C-G formula based on SCr of 1.45 mg/dL (H)). Liver Function Tests: Recent Labs  Lab 10/04/24 1528 10/05/24 0420  AST 31 44*  ALT 50* 42  ALKPHOS 189* 128*  BILITOT 1.5* 1.2  PROT 7.6 6.0*  ALBUMIN 4.2 3.1*   Recent Labs  Lab 10/04/24 1528  LIPASE 30   Coagulation Profile: Recent Labs  Lab 10/05/24 0420  INR 1.2   HbA1C: Recent Labs    10/05/24 0420  HGBA1C 8.1*   CBG: Recent Labs  Lab 10/05/24 0438 10/05/24 0906 10/05/24 1119  GLUCAP 322* 286* 170*   Sepsis Labs: Recent Labs  Lab 10/05/24 0420 10/05/24 0731  LATICACIDVEN 3.9* 3.9*    Recent Results (from the past 240 hours)  Blood culture (routine x 2)     Status: None (Preliminary result)   Collection Time: 10/04/24  8:30 PM   Specimen: BLOOD  Result Value Ref Range Status   Specimen Description   Final    BLOOD LEFT ANTECUBITAL Performed at [facility], [address] [place], [place] [address]    Special Requests   Final    BOTTLES DRAWN AEROBIC AND ANAEROBIC Blood Culture adequate volume Performed at [facility], [address], [place], [place] [address]    Culture   Final    NO GROWTH < 12 HOURS Performed at [facility], [address], [place], [place] [address]    Report Status PENDING  Incomplete  Blood culture (routine x 2)     Status: None (Preliminary  result)   Collection Time: 10/04/24  8:33 PM   Specimen: BLOOD LEFT FOREARM  Result Value Ref Range Status   Specimen Description   Final    BLOOD LEFT FOREARM Performed at [facility], [address] [place], [place] [address]    Special Requests   Final    BOTTLES DRAWN AEROBIC AND ANAEROBIC Blood Culture adequate volume Performed at [facility], [address] [place], [place] [address]    Culture   Final    NO GROWTH < 12 HOURS Performed at [facility], [address] [place], [place] [address]    Report Status PENDING  Incomplete  Surgical pcr screen     Status: Abnormal   Collection Time: 10/05/24 11:57 AM   Specimen: Nasal Mucosa; Nasal Swab  Result Value Ref Range Status   MRSA, PCR POSITIVE (A) NEGATIVE Final    Comment: RESULT CALLED TO, READ BACK BY AND VERIFIED WITH:  FU, C 10/05/2024 1406 AJ    Staphylococcus aureus POSITIVE (A) NEGATIVE Final    Comment: (NOTE) The Xpert SA Assay (FDA approved for NASAL specimens in patients 6 years of age and older), is one component of a comprehensive surveillance program. It is not intended to diagnose infection nor to guide or monitor treatment. Performed at [facility], [address] [place], [place] [address]      Radiology Studies: DG C-Arm 1-60 Min-No Report Result Date: 10/05/2024 Fluoroscopy was utilized by the requesting physician.  No radiographic interpretation.   CT ABDOMEN PELVIS W CONTRAST Result Date: 10/04/2024 EXAM: CT ABDOMEN AND PELVIS WITH CONTRAST 10/04/2024 06:35:51 PM TECHNIQUE: CT of the abdomen and pelvis was performed with the administration of 100 mL of iohexol  (OMNIPAQUE ) 300 MG/ML solution. Multiplanar reformatted images are provided for review. Automated exposure control, iterative reconstruction, and/or weight-based adjustment of the mA/kV was utilized to reduce the radiation dose to as low as reasonably achievable. COMPARISON: None  available. CLINICAL HISTORY: Abdominal pain, acute, nonlocalized. FINDINGS: LOWER CHEST: Moderate hiatal hernia. LIVER: Hepatic steatosis. Subcentimeter hypodensities within the inferior right hepatic  lobe too small to further characterize. GALLBLADDER AND BILE DUCTS: Gallbladder is unremarkable. No biliary ductal dilatation. SPLEEN: No acute abnormality. PANCREAS: No acute abnormality. ADRENAL GLANDS: No acute abnormality. KIDNEYS, URETERS AND BLADDER: Mild left hydronephrosis and proximal hydroureter secondary to a 6 x 14 mm stone in the proximal left ureter slightly distal to the UPJ. Moderate left perinephric fat stranding. Punctate intrarenal calculus in the lower pole of the left kidney. Urinary bladder is unremarkable. GI AND BOWEL: Stomach demonstrates no acute abnormality. There is no bowel obstruction. PERITONEUM AND RETROPERITONEUM: No ascites. No free air. VASCULATURE: Aorta is normal in caliber. LYMPH NODES: No lymphadenopathy. REPRODUCTIVE ORGANS: Uterine masses consistent with fibroids. At least two masses project over the endometrial stripe, the larger measuring 2 cm. Series 5, image 75. These are indeterminate for submucosal masses or endometrial masses. BONES AND SOFT TISSUES: No acute osseous abnormality. No focal soft tissue abnormality. IMPRESSION: 1. Mild left hydronephrosis and proximal hydroureter due to a 6 x 14 mm proximal left ureteral stone slightly distal to the UPJ. Moderate left perinephric fat stranding. Additional punctate left lower pole intrarenal calculus. 2. Moderate hiatal hernia. 3. Uterine fibroids with at least two masses projecting over the endometrial stripe, largest 2 cm; recommend pelvic ultrasound to assess for submucosal versus endometrial origin. his may be performed non-emergently unless otherwise clinically indicated. Electronically signed by: Luke Bun MD 10/04/2024 06:43 PM EST RP Workstation: [id number]    Scheduled Meds:  atorvastatin   40 mg Oral Daily    citalopram   40 mg Oral Daily   insulin aspart  0-6 Units Subcutaneous Q4H   pantoprazole   40 mg Oral Daily   sodium chloride  flush  3 mL Intravenous Q12H   Continuous Infusions:  cefTRIAXone  (ROCEPHIN )  IV     lactated ringers 150 mL/hr (10/05/24 1548)     LOS: 0 days   Time spent: 60 minutes  Camellia Door, DO  [facility]  10/05/2024, 4:44 PM

## 2024-10-05 NOTE — Assessment & Plan Note (Addendum)
 10/05/24 continue with sliding scale insulin.  10/06/24 A1C of 8.1%. pt states she takes trucility at home. Recent uncontrolled DM with hyperglycemia due to stress from infection/kidney stone. F/u with PCP for DM management

## 2024-10-05 NOTE — Anesthesia Preprocedure Evaluation (Addendum)
 Anesthesia Evaluation  Patient identified by MRN, date of birth, ID band Patient awake    Reviewed: Allergy & Precautions, NPO status , Patient's Chart, lab work & pertinent test results  History of Anesthesia Complications Negative for: history of anesthetic complications  Airway Mallampati: III  TM Distance: >3 FB Neck ROM: Full    Dental  (+) Dental Advisory Given   Pulmonary neg pulmonary ROS    + wheezing      Cardiovascular (-) hypertension(-) angina (-) Past MI, (-) Cardiac Stents and (-) CABG (-) dysrhythmias  Rhythm:Regular Rate:Normal  HLD   Neuro/Psych  PSYCHIATRIC DISORDERS Anxiety     negative neurological ROS     GI/Hepatic Neg liver ROS,GERD  Medicated,,  Endo/Other  diabetes (Hgb A1c 8.1), Poorly Controlled, Type 2  Class 3 obesity  Renal/GU Renal disease (left obstructing stone)     Musculoskeletal   Abdominal  (+) + obese  Peds  Hematology  (+) Blood dyscrasia, anemia Lab Results      Component                Value               Date                      WBC                      17.8 (H)            10/05/2024                HGB                      8.4 (L)             10/05/2024                HCT                      27.1 (L)            10/05/2024                MCV                      86.6                10/05/2024                PLT                      190                 10/05/2024              Anesthesia Other Findings On buprenorphine  - patient unsure  Last Trulicity : 09/25/2024  Reproductive/Obstetrics                              Anesthesia Physical Anesthesia Plan  ASA: 3  Anesthesia Plan: General   Post-op Pain Management: Tylenol  PO (pre-op)*   Induction: Intravenous  PONV Risk Score and Plan: 3 and Ondansetron , Dexamethasone  and Treatment may vary due to age or medical condition  Airway Management Planned: LMA  Additional Equipment:    Intra-op Plan:   Post-operative Plan: Extubation in OR  Informed Consent: I have reviewed  the patients History and Physical, chart, labs and discussed the procedure including the risks, benefits and alternatives for the proposed anesthesia with the patient or authorized representative who has indicated his/her understanding and acceptance.     Dental advisory given  Plan Discussed with: CRNA and Anesthesiologist  Anesthesia Plan Comments: (Giving preop albuterol  nebulizer.  Risks of general anesthesia discussed including, but not limited to, sore throat, hoarse voice, chipped/damaged teeth, injury to vocal cords, nausea and vomiting, allergic reactions, lung infection, heart attack, stroke, and death. All questions answered. )         Anesthesia Quick Evaluation

## 2024-10-05 NOTE — Plan of Care (Signed)

## 2024-10-05 NOTE — Assessment & Plan Note (Addendum)
 10/05/24 Status post double-J stent placement with cystoscopy today.  Patient will follow-up with Dr. Renda in urology office.  10/06/24 f/u with Dr. Renda for kidney stone management

## 2024-10-05 NOTE — Anesthesia Postprocedure Evaluation (Signed)
 Anesthesia Post Note  Patient: Jasmin Collins  Procedure(s) Performed: CYSTOSCOPY, WITH URETERAL STENT INSERTION (Left: Ureter)     Patient location during evaluation: PACU Anesthesia Type: General Level of consciousness: awake Pain management: pain level controlled Vital Signs Assessment: post-procedure vital signs reviewed and stable Respiratory status: spontaneous breathing, nonlabored ventilation and respiratory function stable Cardiovascular status: blood pressure returned to baseline and stable Postop Assessment: no apparent nausea or vomiting Anesthetic complications: no   No notable events documented.  Last Vitals:  Vitals:   10/05/24 1206 10/05/24 1215  BP: 120/70 104/63  Pulse: (!) 111 (!) 110  Resp: (!) 24 (!) 21  Temp: 37.2 C   SpO2: 100% 100%    Last Pain:  Vitals:   10/05/24 1206  TempSrc:   PainSc: Asleep                 Delon Aisha Arch

## 2024-10-05 NOTE — Assessment & Plan Note (Addendum)
 10/05/24 present on admission.  Evidenced by white count 11.3, lactic acid of 3.9, heart rate of 106, respirations of 30.  Sepsis due to UTI.  Has acute organ dysfunction with elevated lactic acid but without septic shock.

## 2024-10-05 NOTE — Subjective & Objective (Signed)
 Pt seen and examined. Feeling much better. Little to no pain. Still feels fullness in left flank. Discussed that she will continue to have this sensation until ureteral stent is removed.

## 2024-10-05 NOTE — Op Note (Signed)
 Preoperative diagnosis:  Left ureteral calculus and sepsis   Postoperative diagnosis:  Left ureteral calculus and sepsis   Procedure:  Cystoscopy Left ureteral stent placement (6 x 24 - no string)  Surgeon: Gretel CANDIE Ferrara, Mickey. M.D.  Anesthesia: General  Complications: None  EBL: Minimal  Specimens: None  Indication: Jasmin Collins is a 58 y.o. patient with an obstructing 14 mm left ureteral stone and sepsis. After reviewing the management options for treatment, he elected to proceed with the above surgical procedure(s). We have discussed the potential benefits and risks of the procedure, side effects of the proposed treatment, the likelihood of the patient achieving the goals of the procedure, and any potential problems that might occur during the procedure or recuperation. Informed consent has been obtained.  Description of procedure:  The patient was taken to the operating room and general anesthesia was induced.  The patient was placed in the dorsal lithotomy position, prepped and draped in the usual sterile fashion, and preoperative antibiotics were administered. A preoperative time-out was performed.   Cystourethroscopy was performed.  The patient's urethra was examined and was normal. The bladder was then systematically examined in its entirety. There was no evidence for any bladder tumors, stones, or other mucosal pathology.    Attention then turned to the left ureteral orifice and a ureteral catheter was used to intubate the ureteral orifice.  Omnipaque  contrast was injected through the ureteral catheter and a retrograde pyelogram was performed with findings as dictated above.  The bladder was then emptied and the procedure ended.  The patient appeared to tolerate the procedure well and without complications.  The patient was able to be awakened and transferred to the recovery unit in satisfactory condition.    Gretel CANDIE Ferrara Teddie MD

## 2024-10-05 NOTE — Progress Notes (Signed)
   10/05/24 0825  TOC Brief Assessment  Insurance and Status Reviewed  Patient has primary care physician Yes  Home environment has been reviewed Resides in an apartment  Prior level of function: Independent with ADLs at baseline  Prior/Current Home Services No current home services  Social Drivers of Health Review SDOH reviewed no interventions necessary  Readmission risk has been reviewed Yes  Transition of care needs no transition of care needs at this time

## 2024-10-05 NOTE — Transfer of Care (Signed)
 Immediate Anesthesia Transfer of Care Note  Patient: Jasmin Collins  Procedure(s) Performed: CYSTOSCOPY, WITH RETROGRADE PYELOGRAM AND URETERAL STENT INSERTION (Left)  Patient Location: PACU  Anesthesia Type:General  Level of Consciousness: awake and sedated  Airway & Oxygen Therapy: Patient Spontanous Breathing and Patient connected to face mask oxygen  Post-op Assessment: Report given to RN and Post -op Vital signs reviewed and stable  Post vital signs: Reviewed and stable  Last Vitals:  Vitals Value Taken Time  BP 120/70 10/05/24 12:06  Temp    Pulse 109 10/05/24 12:09  Resp 25 10/05/24 12:09  SpO2 100 % 10/05/24 12:09  Vitals shown include unfiled device data.  Last Pain:  Vitals:   10/05/24 1033  TempSrc: Oral  PainSc: 0-No pain      Patients Stated Pain Goal: 4 (10/05/24 1033)  Complications: No notable events documented.

## 2024-10-05 NOTE — Assessment & Plan Note (Addendum)
 10/05/24 continue with IV antibiotics.  Repeat labs in the morning.  Waiting cultures.  10/06/24 WBC stable. Blood cx negative thus far. Change to po duricef 1000 mg bid x 12 days for treatment of pyelonephritis.

## 2024-10-05 NOTE — Hospital Course (Signed)
 CC: left sided abd pain, gross hematuria HPI:  Jasmin Collins is a 58 y.o. female with medical history significant for anxiety, type 2 diabetes mellitus, GERD, and chronic low back pain who presents with left-sided abdominal pain, nausea, vomiting, gross hematuria, and chills.   Patient reports that she was in her usual state last night but this morning developed severe left-sided abdominal pain.  She went on to develop nausea with vomiting and severe, shaking chills.  She also noted gross hematuria.  She said evaluation at her PCP office was directed to the ED for expedited workup and treatment.   [facility] ED Course: In the ED, patient is found to be febrile, tachypneic, and tachycardic with stable BP.  Labs are most notable for normal creatinine, glucose 358, WBC 11,300, and hemoglobin 10.4.  CT demonstrates mild left hydronephrosis and proximal left hydroureter due to 6 x 14 mm proximal left ureteral stone.  Also noted on CT is perinephric fat stranding on the left.   Urology (Dr. Sherrilee) was consulted by the ED PA, blood and urine cultures were collected, and the patient was given 2 L of IVF, Zofran , Reglan, 2 g IV Rocephin , morphine, and acetaminophen .  She was transferred to [facility] for admission.  Significant Events: Admitted 10/04/2024 for obstructive Left-sided pyelonephritis   Admission Labs: WBC 11.3, HgB 10.4, plt 274 UA brown and cloudy, spg 1.036, protein >300, negative LE, large LE, RBC >50, WBC >50 Na 135, K 4.2, CO2 of 23, BUN 13, Scr 0.84, glu 358 T prot 7.6, AST 31, ALT 50, alk phos 189, t. Bili 1.5 Lipase 30  Admission Imaging Studies: CT abd/pelvis Mild left hydronephrosis and proximal hydroureter due to a 6 x 14 mm proximal left ureteral stone slightly distal to the UPJ. Moderate left perinephric fat stranding. Additional punctate left lower pole intrarenal calculus. 2. Moderate hiatal hernia. 3. Uterine fibroids with at least two masses  projecting over the endometrial stripe, largest 2 cm; recommend pelvic ultrasound to assess for submucosal versus endometrial origin. his may be performed non-emergently unless otherwise clinically indicated.  Significant Labs:   Significant Imaging Studies:   Antibiotic Therapy: Anti-infectives (From admission, onward)    Start     Dose/Rate Route Frequency Ordered Stop   10/05/24 1800  cefTRIAXone  (ROCEPHIN ) 2 g in sodium chloride  0.9 % 100 mL IVPB        2 g 200 mL/hr over 30 Minutes Intravenous Every 24 hours 10/05/24 0247 10/11/24 1759   10/04/24 1900  cefTRIAXone  (ROCEPHIN ) 1 g in sodium chloride  0.9 % 100 mL IVPB        1 g 200 mL/hr over 30 Minutes Intravenous  Once 10/04/24 1857 10/04/24 2042   10/04/24 1715  cefTRIAXone  (ROCEPHIN ) 1 g in sodium chloride  0.9 % 100 mL IVPB        1 g 200 mL/hr over 30 Minutes Intravenous  Once 10/04/24 1704 10/04/24 1750       Procedures: 10-06-2024 cystoscopy with left side JJ ureteral stent placement.  Consultants: urology

## 2024-10-05 NOTE — Assessment & Plan Note (Signed)
 10/05/24 due to pyelonephritis and obstructive uropathy.  Continue IV fluids.  Repeat chemistry in the morning.  10/06/24 Scr 0.67 today. Resolved.

## 2024-10-05 NOTE — Consult Note (Addendum)
 Urology Consult Note   Requesting Attending Physician:  Laurence Locus, DO Service Providing Consult: Urology  Consulting Attending: Dr. Renda   Reason for Consult:  left ureteral stone  HPI: Jasmin Collins is seen in consultation for reasons noted above at the request of Laurence Locus, DO. Patient is a 58 y.o. female initially presenting to [facility] emergency department with severe suprapubic and abdominal pain beginning on the morning of 10/04/2024.  Patient reports nausea, dizziness, cough, shortness of breath, and gross hematuria for a few days.  CT A/P noted obstructing 6 x 14 mm proximal ureteral stone.  Other pertinent labs reflected AKI, lactic acidosis, leukocytosis, and fever-Tmax 103.  Patient was transported to [facility] to expedite formal urology assessment and likely ureteral stent placement.  Upon arrival patient was resting in bed.  She was alert, oriented, in no distress.  We reviewed the natural history of her nephrolithiasis, what was causing her pain, and available treatment options to her.  She expressed understanding and had no further questions.  ------------------  Assessment:   58 y.o. female with left obstructing ureteral stone   Recommendations: # Left ureteral stone # Dehydration # AKI # Sepsis  Volume resuscitation in the emergency department and ongoing IV fluids.  Curiously she has no bacteria present on her urinalysis but all other variables point towards a developing septic picture.  Agree with broad ABX, volume resuscitation, and close monitoring.  Multifactorial AKI, elevated glucose, obstructing stone, and dehydration.  Expect to resolve with volume resuscitation and ureteral stent.  Trend labs  To the OR with Dr. Renda around 11 AM for cystoscopy with left retrograde pyelogram and left ureteral stent placement.  Definitive stone management on an outpatient basis in a few weeks after she has completed ABX and cleared infection.  Keep  n.p.o.  Case and plan discussed with Dr. Renda  Past Medical History: Past Medical History:  Diagnosis Date   Anxiety    Diabetes mellitus without complication (HCC)    GERD (gastroesophageal reflux disease)     Past Surgical History:  History reviewed. No pertinent surgical history.  Medication: Current Facility-Administered Medications  Medication Dose Route Frequency Provider Last Rate Last Admin   acetaminophen  (TYLENOL ) tablet 650 mg  650 mg Oral Q6H PRN Opyd, Timothy S, MD       Or   acetaminophen  (TYLENOL ) suppository 650 mg  650 mg Rectal Q6H PRN Opyd, Timothy S, MD       albuterol  (PROVENTIL ) (2.5 MG/3ML) 0.083% nebulizer solution 2.5 mg  2.5 mg Nebulization Q6H PRN Opyd, Timothy S, MD       atorvastatin  (LIPITOR) tablet 40 mg  40 mg Oral Daily Opyd, Timothy S, MD       cefTRIAXone  (ROCEPHIN ) 2 g in sodium chloride  0.9 % 100 mL IVPB  2 g Intravenous Q24H Opyd, Timothy S, MD       citalopram  (CELEXA ) tablet 40 mg  40 mg Oral Daily Opyd, Timothy S, MD       HYDROmorphone (DILAUDID) injection 0.5 mg  0.5 mg Intravenous Q4H PRN Opyd, Timothy S, MD       hydrOXYzine  (ATARAX ) tablet 50 mg  50 mg Oral Q6H PRN Opyd, Timothy S, MD       insulin aspart (novoLOG) injection 0-6 Units  0-6 Units Subcutaneous Q4H Opyd, Timothy S, MD   3 Units at 10/05/24 0915   lactated ringers infusion  150 mL/hr Intravenous Continuous Opyd, Evalene RAMAN, MD 150 mL/hr at 10/05/24  0408 150 mL/hr at 10/05/24 0408   ondansetron  (ZOFRAN ) tablet 4 mg  4 mg Oral Q6H PRN Opyd, Timothy S, MD       Or   ondansetron  (ZOFRAN ) injection 4 mg  4 mg Intravenous Q6H PRN Opyd, Timothy S, MD       oxyCODONE -acetaminophen  (PERCOCET/ROXICET) 5-325 MG per tablet 1 tablet  1 tablet Oral QID PRN Opyd, Evalene RAMAN, MD       And   oxyCODONE  (Oxy IR/ROXICODONE ) immediate release tablet 5 mg  5 mg Oral QID PRN Opyd, Timothy S, MD       pantoprazole  (PROTONIX ) EC tablet 40 mg  40 mg Oral Daily Opyd, Timothy S, MD       polyethylene  glycol (MIRALAX / GLYCOLAX) packet 17 g  17 g Oral Daily PRN Opyd, Timothy S, MD       sodium chloride  flush (NS) 0.9 % injection 3 mL  3 mL Intravenous Q12H Opyd, Timothy S, MD   3 mL at 10/05/24 0303    Allergies: No Known Allergies  Social History: Social History   Tobacco Use   Smoking status: Never   Smokeless tobacco: Never  Vaping Use   Vaping status: Never Used  Substance Use Topics   Alcohol use: No   Drug use: No    Family History Family History  Problem Relation Age of Onset   Hypertension Mother     Review of Systems  Constitutional:  Positive for fever.  Genitourinary:  Positive for flank pain.     Objective   Vital signs in last 24 hours: BP 111/77 (BP Location: Right Arm)   Pulse 93   Temp 98.4 F (36.9 C) (Oral)   Resp (!) 26   Ht 5' 2 (1.575 m)   Wt 101.6 kg   LMP  (LMP Unknown)   SpO2 98%   BMI 40.97 kg/m   Physical Exam General: A&O, resting, appropriate HEENT: [place]/AT Pulmonary: Normal work of breathing Cardiovascular: no cyanosis    Most Recent Labs: Lab Results  Component Value Date   WBC 17.8 (H) 10/05/2024   HGB 8.4 (L) 10/05/2024   HCT 27.1 (L) 10/05/2024   PLT 190 10/05/2024    Lab Results  Component Value Date   NA 137 10/05/2024   K 4.2 10/05/2024   CL 104 10/05/2024   CO2 23 10/05/2024   BUN 17 10/05/2024   CREATININE 1.45 (H) 10/05/2024   CALCIUM  8.5 (L) 10/05/2024    Lab Results  Component Value Date   INR 1.2 10/05/2024   APTT 28 10/05/2024     Urine Culture: @[id number] (laburin,org,[id number],[id number])@   IMAGING: CT ABDOMEN PELVIS W CONTRAST Result Date: 10/04/2024 EXAM: CT ABDOMEN AND PELVIS WITH CONTRAST 10/04/2024 06:35:51 PM TECHNIQUE: CT of the abdomen and pelvis was performed with the administration of 100 mL of iohexol  (OMNIPAQUE ) 300 MG/ML solution. Multiplanar reformatted images are provided for review. Automated exposure control, iterative reconstruction, and/or weight-based adjustment of the  mA/kV was utilized to reduce the radiation dose to as low as reasonably achievable. COMPARISON: None available. CLINICAL HISTORY: Abdominal pain, acute, nonlocalized. FINDINGS: LOWER CHEST: Moderate hiatal hernia. LIVER: Hepatic steatosis. Subcentimeter hypodensities within the inferior right hepatic lobe too small to further characterize. GALLBLADDER AND BILE DUCTS: Gallbladder is unremarkable. No biliary ductal dilatation. SPLEEN: No acute abnormality. PANCREAS: No acute abnormality. ADRENAL GLANDS: No acute abnormality. KIDNEYS, URETERS AND BLADDER: Mild left hydronephrosis and proximal hydroureter secondary to a 6 x 14 mm stone in the proximal left  ureter slightly distal to the UPJ. Moderate left perinephric fat stranding. Punctate intrarenal calculus in the lower pole of the left kidney. Urinary bladder is unremarkable. GI AND BOWEL: Stomach demonstrates no acute abnormality. There is no bowel obstruction. PERITONEUM AND RETROPERITONEUM: No ascites. No free air. VASCULATURE: Aorta is normal in caliber. LYMPH NODES: No lymphadenopathy. REPRODUCTIVE ORGANS: Uterine masses consistent with fibroids. At least two masses project over the endometrial stripe, the larger measuring 2 cm. Series 5, image 75. These are indeterminate for submucosal masses or endometrial masses. BONES AND SOFT TISSUES: No acute osseous abnormality. No focal soft tissue abnormality. IMPRESSION: 1. Mild left hydronephrosis and proximal hydroureter due to a 6 x 14 mm proximal left ureteral stone slightly distal to the UPJ. Moderate left perinephric fat stranding. Additional punctate left lower pole intrarenal calculus. 2. Moderate hiatal hernia. 3. Uterine fibroids with at least two masses projecting over the endometrial stripe, largest 2 cm; recommend pelvic ultrasound to assess for submucosal versus endometrial origin. his may be performed non-emergently unless otherwise clinically indicated. Electronically signed by: Luke Bun MD  10/04/2024 06:43 PM EST RP Workstation: [id number]    ------  Ole Bourdon, NP Pager: [phone]   Please contact the urology consult pager with any further questions/concerns.

## 2024-10-05 NOTE — H&P (Signed)
 History and Physical    Jasmin Collins FMW:[id number] DOB: 1966-10-04 DOA: 10/04/2024  PCP: Janna Ferrier, DO   Patient coming from: Home   Chief Complaint: Left-side abdominal pain, gross hematuria, N/V, chills   HPI: Jasmin Collins is a 58 y.o. female with medical history significant for anxiety, type 2 diabetes mellitus, GERD, and chronic low back pain who presents with left-sided abdominal pain, nausea, vomiting, gross hematuria, and chills.  Patient reports that she was in her usual state last night but this morning developed severe left-sided abdominal pain.  She went on to develop nausea with vomiting and severe, shaking chills.  She also noted gross hematuria.  She said evaluation at her PCP office was directed to the ED for expedited workup and treatment.  [facility] ED Course: In the ED, patient is found to be febrile, tachypneic, and tachycardic with stable BP.  Labs are most notable for normal creatinine, glucose 358, WBC 11,300, and hemoglobin 10.4.  CT demonstrates mild left hydronephrosis and proximal left hydroureter due to 6 x 14 mm proximal left ureteral stone.  Also noted on CT is perinephric fat stranding on the left.  Urology (Dr. Sherrilee) was consulted by the ED PA, blood and urine cultures were collected, and the patient was given 2 L of IVF, Zofran , Reglan, 2 g IV Rocephin , morphine, and acetaminophen .  She was transferred to [facility] for admission.  Review of Systems:  All other systems reviewed and apart from HPI, are negative.  Past Medical History:  Diagnosis Date   Anxiety    Diabetes mellitus without complication (HCC)    GERD (gastroesophageal reflux disease)     History reviewed. No pertinent surgical history.  Social History:   reports that she has never smoked. She has never used smokeless tobacco. She reports that she does not drink alcohol and does not use drugs.  No Known Allergies  Family History  Problem Relation  Age of Onset   Hypertension Mother      Prior to Admission medications   Medication Sig Start Date End Date Taking? Authorizing Provider  albuterol  (VENTOLIN  HFA) 108 (90 Base) MCG/ACT inhaler Inhale 2 puffs into the lungs every 6 (six) hours as needed for wheezing or shortness of breath. 09/04/24   Blair, Diane W, FNP  atorvastatin  (LIPITOR) 40 MG tablet Take 1 tablet (40 mg total) by mouth daily. 12/21/23   Gomes, Adriana, DO  Buprenorphine  HCl (BELBUCA ) 600 MCG FILM Place 1 Film (600 mcg) inside cheek 2 (two) times daily. 04/22/24     citalopram  (CELEXA ) 40 MG tablet TAKE 1 TABLET BY MOUTH EVERY DAY 07/08/24   Janna Ferrier, DO  COVID-19 Antibody Test KIT Use as directed on package instructions 08/11/24   Vivienne Delon HERO, PA-C  Dulaglutide  (TRULICITY ) 3 MG/0.5ML SOAJ INJECT 3 MG AS DIRECTED ONCE A WEEK. 10/04/24   Janna Ferrier, DO  hydrOXYzine  (ATARAX ) 50 MG tablet TAKE 1-2 TABLETS EVERY 6HRS AS NEEDED FOR ANXIETY 09/23/24   Gomes, Adriana, DO  ibuprofen  (ADVIL ) 600 MG tablet Take 1 tablet (600 mg total) by mouth every 8 (eight) hours as needed. 08/11/24   Vivienne Delon HERO, PA-C  oxyCODONE -acetaminophen  (PERCOCET) 10-325 MG tablet Take 1 tablet by mouth 4 (four) times daily as needed. 10/23/23     oxyCODONE -acetaminophen  (PERCOCET) 10-325 MG tablet Take 1 tablet by mouth 4 (four) times daily as needed. 11/26/23     oxyCODONE -acetaminophen  (PERCOCET) 10-325 MG tablet Take 1 tablet by mouth 4 (four) times daily  as needed. 08/11/24     oxyCODONE -acetaminophen  (PERCOCET) 10-325 MG tablet Take 1 tablet by mouth 4 (four) times daily as needed. 09/26/24     pantoprazole  (PROTONIX ) 40 MG tablet TAKE 1 TABLET(40 MG) BY MOUTH DAILY 04/13/24   Janna Ferrier, DO  PEG 3350-KCl-NaCl-NaSulf-MgSul (SUFLAVE) 178.7 g SOLR Take as directed. 09/27/24     promethazine -dextromethorphan (PROMETHAZINE -DM) 6.25-15 MG/5ML syrup Take 5 mLs by mouth 4 (four) times daily as needed. 09/27/24   Janna Ferrier, DO     Physical Exam: Vitals:   10/05/24 0059 10/05/24 0100 10/05/24 0230 10/05/24 0237  BP:    105/62  Pulse:  (!) 120  (!) 116  Resp:  (!) 25  20  Temp: (!) 102.4 F (39.1 C)  (!) 103 F (39.4 C) (!) 100.9 F (38.3 C)  TempSrc: Oral  Oral Oral  SpO2:  97%  100%    Constitutional: NAD, calm  Eyes: PERTLA, lids and conjunctivae normal ENMT: Mucous membranes are moist. Posterior pharynx clear of any exudate or lesions.   Neck: supple, no masses  Respiratory: no wheezing, no crackles. No accessory muscle use.  Cardiovascular: S1 & S2 heard, regular rate and rhythm. No extremity edema.   Abdomen: Soft, tender in left abdomen without peritonitis. Bowel sounds active.  Musculoskeletal: no clubbing / cyanosis. No joint deformity upper and lower extremities.   Skin: no significant rashes, lesions, ulcers. Warm, dry, well-perfused. Neurologic: CN 2-12 grossly intact. Moving all extremities. Alert and oriented.  Psychiatric: Pleasant. Cooperative.    Labs and Imaging on Admission: I have personally reviewed following labs and imaging studies  CBC: Recent Labs  Lab 10/04/24 1528  WBC 11.3*  HGB 10.4*  HCT 32.8*  MCV 83.5  PLT 274   Basic Metabolic Panel: Recent Labs  Lab 10/04/24 1528  NA 135  K 4.2  CL 100  CO2 23  GLUCOSE 358*  BUN 13  CREATININE 0.84  CALCIUM  9.7   GFR: CrCl cannot be calculated (Unknown ideal weight.). Liver Function Tests: Recent Labs  Lab 10/04/24 1528  AST 31  ALT 50*  ALKPHOS 189*  BILITOT 1.5*  PROT 7.6  ALBUMIN 4.2   Recent Labs  Lab 10/04/24 1528  LIPASE 30   No results for input(s): AMMONIA in the last 168 hours. Coagulation Profile: No results for input(s): INR, PROTIME in the last 168 hours. Cardiac Enzymes: No results for input(s): CKTOTAL, CKMB, CKMBINDEX, TROPONINI in the last 168 hours. BNP (last 3 results) No results for input(s): PROBNP in the last 8760 hours. HbA1C: No results for input(s):  HGBA1C in the last 72 hours. CBG: No results for input(s): GLUCAP in the last 168 hours. Lipid Profile: No results for input(s): CHOL, HDL, LDLCALC, TRIG, CHOLHDL, LDLDIRECT in the last 72 hours. Thyroid Function Tests: No results for input(s): TSH, T4TOTAL, FREET4, T3FREE, THYROIDAB in the last 72 hours. Anemia Panel: No results for input(s): VITAMINB12, FOLATE, FERRITIN, TIBC, IRON, RETICCTPCT in the last 72 hours. Urine analysis:    Component Value Date/Time   COLORURINE BROWN (A) 10/04/2024 1528   APPEARANCEUR CLOUDY (A) 10/04/2024 1528   LABSPEC 1.036 (H) 10/04/2024 1528   PHURINE 6.0 10/04/2024 1528   GLUCOSEU 250 (A) 10/04/2024 1528   HGBUR LARGE (A) 10/04/2024 1528   BILIRUBINUR NEGATIVE 10/04/2024 1528   KETONESUR 15 (A) 10/04/2024 1528   PROTEINUR >300 (A) 10/04/2024 1528   UROBILINOGEN 0.2 07/26/2018 0901   NITRITE NEGATIVE 10/04/2024 1528   LEUKOCYTESUR LARGE (A) 10/04/2024 1528   Sepsis  Labs: @LABRCNTIP (procalcitonin:4,lacticidven:4) ) Recent Results (from the past 240 hours)  Blood culture (routine x 2)     Status: None (Preliminary result)   Collection Time: 10/04/24  8:33 PM   Specimen: BLOOD LEFT FOREARM  Result Value Ref Range Status   Specimen Description   Final    BLOOD LEFT FOREARM Performed at [facility], [address] [place], [place] [address]    Special Requests   Final    BOTTLES DRAWN AEROBIC AND ANAEROBIC Blood Culture adequate volume Performed at [facility], [address], [place], [place] [address]    Culture PENDING  Incomplete   Report Status PENDING  Incomplete     Radiological Exams on Admission: CT ABDOMEN PELVIS W CONTRAST Result Date: 10/04/2024 EXAM: CT ABDOMEN AND PELVIS WITH CONTRAST 10/04/2024 06:35:51 PM TECHNIQUE: CT of the abdomen and pelvis was performed with the administration of 100 mL of iohexol  (OMNIPAQUE ) 300 MG/ML solution. Multiplanar reformatted  images are provided for review. Automated exposure control, iterative reconstruction, and/or weight-based adjustment of the mA/kV was utilized to reduce the radiation dose to as low as reasonably achievable. COMPARISON: None available. CLINICAL HISTORY: Abdominal pain, acute, nonlocalized. FINDINGS: LOWER CHEST: Moderate hiatal hernia. LIVER: Hepatic steatosis. Subcentimeter hypodensities within the inferior right hepatic lobe too small to further characterize. GALLBLADDER AND BILE DUCTS: Gallbladder is unremarkable. No biliary ductal dilatation. SPLEEN: No acute abnormality. PANCREAS: No acute abnormality. ADRENAL GLANDS: No acute abnormality. KIDNEYS, URETERS AND BLADDER: Mild left hydronephrosis and proximal hydroureter secondary to a 6 x 14 mm stone in the proximal left ureter slightly distal to the UPJ. Moderate left perinephric fat stranding. Punctate intrarenal calculus in the lower pole of the left kidney. Urinary bladder is unremarkable. GI AND BOWEL: Stomach demonstrates no acute abnormality. There is no bowel obstruction. PERITONEUM AND RETROPERITONEUM: No ascites. No free air. VASCULATURE: Aorta is normal in caliber. LYMPH NODES: No lymphadenopathy. REPRODUCTIVE ORGANS: Uterine masses consistent with fibroids. At least two masses project over the endometrial stripe, the larger measuring 2 cm. Series 5, image 75. These are indeterminate for submucosal masses or endometrial masses. BONES AND SOFT TISSUES: No acute osseous abnormality. No focal soft tissue abnormality. IMPRESSION: 1. Mild left hydronephrosis and proximal hydroureter due to a 6 x 14 mm proximal left ureteral stone slightly distal to the UPJ. Moderate left perinephric fat stranding. Additional punctate left lower pole intrarenal calculus. 2. Moderate hiatal hernia. 3. Uterine fibroids with at least two masses projecting over the endometrial stripe, largest 2 cm; recommend pelvic ultrasound to assess for submucosal versus endometrial origin.  his may be performed non-emergently unless otherwise clinically indicated. Electronically signed by: Luke Bun MD 10/04/2024 06:43 PM EST RP Workstation: [id number]    EKG: Independently reviewed. Sinus rhythm.   Assessment/Plan   1. Obstructive pyelonephritis, left  - Presents with severe left-sided abdominal pain, gross hematuria, N/V, and chills  - CT concerning for left ureteral stone with mild hydroureteronephrosis and perinephric stranding; she has become febrile and tachycardic  - Blood and urine cultures were collected in ED, IV fluid bolus was given, Rocephin  was started, and urology consulted  - Check/trend lactate, continue NPO and IVF hydration, continue Rocephin , follow cultures and clinical course    2. Type II DM  - Check CBGs and use low-intensity SSI for now    3. Anxiety  - Continue Celexa  and as-needed hydroxyzine    4. Chronic low back pain  - Treating acute pain currently     DVT prophylaxis:  SCDs  Code Status: Full  Level of Care: Level of care: Progressive Family Communication: None present  Disposition Plan:  Patient is from: Home  Anticipated d/c is to: Home  Anticipated d/c date is: 10/08/24  Patient currently: Pending treatment of obstructive pyelonephritis  Consults called: Urology  Admission status: Observation     Jasmin Collins Sprinkles, MD [facility]  10/05/2024, 2:59 AM

## 2024-10-05 NOTE — Assessment & Plan Note (Addendum)
 10/05/24 will need definitive stone management as an outpatient.  10/06/24 f/u with Dr. Renda for kidney stone management

## 2024-10-05 NOTE — Assessment & Plan Note (Signed)
Body mass index is 40.97 kg/m.

## 2024-10-06 ENCOUNTER — Encounter (HOSPITAL_COMMUNITY): Payer: Self-pay | Admitting: Urology

## 2024-10-06 ENCOUNTER — Other Ambulatory Visit: Payer: Self-pay

## 2024-10-06 ENCOUNTER — Other Ambulatory Visit (HOSPITAL_COMMUNITY): Payer: Self-pay

## 2024-10-06 DIAGNOSIS — N111 Chronic obstructive pyelonephritis: Secondary | ICD-10-CM | POA: Diagnosis not present

## 2024-10-06 DIAGNOSIS — D6489 Other specified anemias: Secondary | ICD-10-CM

## 2024-10-06 DIAGNOSIS — Z466 Encounter for fitting and adjustment of urinary device: Secondary | ICD-10-CM | POA: Diagnosis not present

## 2024-10-06 DIAGNOSIS — N200 Calculus of kidney: Secondary | ICD-10-CM | POA: Diagnosis not present

## 2024-10-06 LAB — COMPREHENSIVE METABOLIC PANEL WITH GFR
ALT: 35 U/L (ref 0–44)
AST: 25 U/L (ref 15–41)
Albumin: 3.1 g/dL — ABNORMAL LOW (ref 3.5–5.0)
Alkaline Phosphatase: 138 U/L — ABNORMAL HIGH (ref 38–126)
Anion gap: 8 (ref 5–15)
BUN: 16 mg/dL (ref 6–20)
CO2: 23 mmol/L (ref 22–32)
Calcium: 8.7 mg/dL — ABNORMAL LOW (ref 8.9–10.3)
Chloride: 103 mmol/L (ref 98–111)
Creatinine, Ser: 0.67 mg/dL (ref 0.44–1.00)
GFR, Estimated: >60 mL/min (ref >60)
Glucose, Bld: 431 mg/dL — ABNORMAL HIGH (ref 70–99)
Potassium: 4.6 mmol/L (ref 3.5–5.1)
Sodium: 135 mmol/L (ref 135–145)
Total Bilirubin: 0.7 mg/dL (ref 0.0–1.2)
Total Protein: 6.4 g/dL — ABNORMAL LOW (ref 6.5–8.1)

## 2024-10-06 LAB — CBC WITH DIFFERENTIAL/PLATELET
Abs Immature Granulocytes: 0.15 K/uL — ABNORMAL HIGH (ref 0.00–0.07)
Basophils Absolute: 0 K/uL (ref 0.0–0.1)
Basophils Relative: 0 %
Eosinophils Absolute: 0 K/uL (ref 0.0–0.5)
Eosinophils Relative: 0 %
HCT: 27.6 % — ABNORMAL LOW (ref 36.0–46.0)
Hemoglobin: 8.5 g/dL — ABNORMAL LOW (ref 12.0–15.0)
Immature Granulocytes: 1 %
Lymphocytes Relative: 5 %
Lymphs Abs: 0.8 K/uL (ref 0.7–4.0)
MCH: 26.8 pg (ref 26.0–34.0)
MCHC: 30.8 g/dL (ref 30.0–36.0)
MCV: 87.1 fL (ref 80.0–100.0)
Monocytes Absolute: 0.5 K/uL (ref 0.1–1.0)
Monocytes Relative: 3 %
Neutro Abs: 15.5 K/uL — ABNORMAL HIGH (ref 1.7–7.7)
Neutrophils Relative %: 91 %
Platelets: 184 K/uL (ref 150–400)
RBC: 3.17 MIL/uL — ABNORMAL LOW (ref 3.87–5.11)
RDW: 17.1 % — ABNORMAL HIGH (ref 11.5–15.5)
Smear Review: NORMAL
WBC: 16.9 K/uL — ABNORMAL HIGH (ref 4.0–10.5)
nRBC: 0 % (ref 0.0–0.2)

## 2024-10-06 LAB — GLUCOSE, CAPILLARY
Glucose-Capillary: 490 mg/dL — ABNORMAL HIGH (ref 70–99)
Glucose-Capillary: 410 mg/dL — ABNORMAL HIGH (ref 70–99)
Glucose-Capillary: 222 mg/dL — ABNORMAL HIGH (ref 70–99)
Glucose-Capillary: 138 mg/dL — ABNORMAL HIGH (ref 70–99)

## 2024-10-06 LAB — URINE CULTURE

## 2024-10-06 LAB — IRON AND TIBC
Iron: <10 ug/dL — ABNORMAL LOW (ref 28–170)
TIBC: 339 ug/dL (ref 250–450)

## 2024-10-06 MED ORDER — OXYCODONE HCL 5 MG PO TABS: 5.0000 mg | ORAL_TABLET | Freq: Four times a day (QID) | ORAL | 0 refills | Status: AC | PRN

## 2024-10-06 MED ORDER — BLOOD GLUCOSE MONITORING SUPPL DEVI: 1.0000 | 0 refills | Status: AC

## 2024-10-06 MED ORDER — INSULIN ASPART 100 UNIT/ML IJ SOLN
20.0000 [IU] | INTRAMUSCULAR | Status: AC
  Administered 2024-10-06: 20 [IU] via SUBCUTANEOUS

## 2024-10-06 MED ORDER — ONDANSETRON HCL 4 MG PO TABS: 4.0000 mg | ORAL_TABLET | Freq: Four times a day (QID) | ORAL | 0 refills | Status: AC | PRN

## 2024-10-06 MED ORDER — INSULIN ASPART 100 UNIT/ML IJ SOLN
20.0000 [IU] | INTRAMUSCULAR | Status: AC
  Filled 2024-10-06: qty 20

## 2024-10-06 MED ORDER — POLYETHYLENE GLYCOL 3350 17 G PO PACK: 17.0000 g | PACK | Freq: Every day | ORAL | 0 refills | Status: DC | PRN

## 2024-10-06 MED ORDER — SODIUM CHLORIDE 0.9 % IV SOLN
2.0000 g | INTRAVENOUS | Status: DC
  Administered 2024-10-06: 2 g via INTRAVENOUS
  Filled 2024-10-06: qty 20

## 2024-10-06 MED ORDER — LANCET DEVICE MISC: 1.0000 | 0 refills | Status: AC

## 2024-10-06 MED ORDER — BLOOD GLUCOSE TEST VI STRP: 1.0000 | ORAL_STRIP | 0 refills | Status: AC

## 2024-10-06 MED ORDER — CEFADROXIL 500 MG PO CAPS: 1000.0000 mg | ORAL_CAPSULE | Freq: Two times a day (BID) | ORAL | 0 refills | Status: AC

## 2024-10-06 MED ORDER — LANCETS MISC: 1.0000 | 0 refills | Status: AC

## 2024-10-06 NOTE — Discharge Instructions (Signed)
 1. Follow up with your primary care provider in 1-2 weeks following discharge from hospital.  2. Follow up with Dr. Renda with urology in 2 weeks. Call for appointment.

## 2024-10-06 NOTE — Progress Notes (Addendum)
 1 Day Post-Op Subjective: No acute events overnight.  Patient resting comfortably in bed on rounds this morning.  Patient denies pain or fever.  Reviewed plans going forward for definitive stone management  Objective: Vital signs in last 24 hours: Temp:  [98 F (36.7 C)-99.4 F (37.4 C)] 98 F (36.7 C) (11/06 0603) Pulse Rate:  [84-116] 91 (11/06 0603) Resp:  [16-24] 19 (11/06 0603) BP: (104-142)/(63-92) 137/82 (11/06 0603) SpO2:  [97 %-100 %] 98 % (11/06 0603)  Assessment/Plan: # Left ureteral stone # Dehydration # AKI # Sepsis  To the OR with Dr. Renda for left ureteral stent on 10/05/2024. Patient is afebrile and hemodynamically stable. Ongoing but improved leukocytosis with normalizing renal indices. Definitive stone management on an outpatient basis after patient has completed at least 7-10 days of culture directed antibiotics. Okay for discharge from urologic perspective.  Intake/Output from previous day: 11/05 0701 - 11/06 0700 In: 1151.7 [I.V.:1051.7; IV Piggyback:100] Out: 701 [Urine:701]  Intake/Output this shift: Total I/O In: 240 [P.O.:240] Out: -   Physical Exam:  General: Alert and oriented CV: No cyanosis Lungs: equal chest rise    Lab Results: Recent Labs    10/04/24 1528 10/05/24 0420 10/06/24 0420  HGB 10.4* 8.4* 8.5*  HCT 32.8* 27.1* 27.6*   BMET Recent Labs    10/05/24 0420 10/06/24 0420  NA 137 135  K 4.2 4.6  CL 104 103  CO2 23 23  GLUCOSE 367* 431*  BUN 17 16  CREATININE 1.45* 0.67  CALCIUM  8.5* 8.7*  HGB 8.4* 8.5*  WBC 17.8* 16.9*     Studies/Results: DG C-Arm 1-60 Min-No Report Result Date: 10/05/2024 Fluoroscopy was utilized by the requesting physician.  No radiographic interpretation.   CT ABDOMEN PELVIS W CONTRAST Result Date: 10/04/2024 EXAM: CT ABDOMEN AND PELVIS WITH CONTRAST 10/04/2024 06:35:51 PM TECHNIQUE: CT of the abdomen and pelvis was performed with the administration of 100 mL of iohexol  (OMNIPAQUE )  300 MG/ML solution. Multiplanar reformatted images are provided for review. Automated exposure control, iterative reconstruction, and/or weight-based adjustment of the mA/kV was utilized to reduce the radiation dose to as low as reasonably achievable. COMPARISON: None available. CLINICAL HISTORY: Abdominal pain, acute, nonlocalized. FINDINGS: LOWER CHEST: Moderate hiatal hernia. LIVER: Hepatic steatosis. Subcentimeter hypodensities within the inferior right hepatic lobe too small to further characterize. GALLBLADDER AND BILE DUCTS: Gallbladder is unremarkable. No biliary ductal dilatation. SPLEEN: No acute abnormality. PANCREAS: No acute abnormality. ADRENAL GLANDS: No acute abnormality. KIDNEYS, URETERS AND BLADDER: Mild left hydronephrosis and proximal hydroureter secondary to a 6 x 14 mm stone in the proximal left ureter slightly distal to the UPJ. Moderate left perinephric fat stranding. Punctate intrarenal calculus in the lower pole of the left kidney. Urinary bladder is unremarkable. GI AND BOWEL: Stomach demonstrates no acute abnormality. There is no bowel obstruction. PERITONEUM AND RETROPERITONEUM: No ascites. No free air. VASCULATURE: Aorta is normal in caliber. LYMPH NODES: No lymphadenopathy. REPRODUCTIVE ORGANS: Uterine masses consistent with fibroids. At least two masses project over the endometrial stripe, the larger measuring 2 cm. Series 5, image 75. These are indeterminate for submucosal masses or endometrial masses. BONES AND SOFT TISSUES: No acute osseous abnormality. No focal soft tissue abnormality. IMPRESSION: 1. Mild left hydronephrosis and proximal hydroureter due to a 6 x 14 mm proximal left ureteral stone slightly distal to the UPJ. Moderate left perinephric fat stranding. Additional punctate left lower pole intrarenal calculus. 2. Moderate hiatal hernia. 3. Uterine fibroids with at least two masses projecting over the  endometrial stripe, largest 2 cm; recommend pelvic ultrasound to assess  for submucosal versus endometrial origin. his may be performed non-emergently unless otherwise clinically indicated. Electronically signed by: Luke Bun MD 10/04/2024 06:43 PM EST RP Workstation: [id number]      LOS: 1 day   Ole Bourdon, NP [facility] Pager: [phone]  10/06/2024, 8:44 AM   I have seen and examined the patient and agree with the above assessment and plan.  Pt doing much better.  Expected stent symptoms.  After reviewing options for definitive stone management (ESWL vs ureteroscopic treatment), she elects to proceed with ureteroscopic laser lithotripsy.  I discussed the potential benefits and risks of the procedure, side effects of the proposed treatment, the likelihood of the patient achieving the goals of the procedure, and any potential problems that might occur during the procedure or recuperation.   Will plan to schedule outpatient surgery in > 2 weeks with plans to have her return to our office to ensure resolution of infection.  In absence of definitive urine culture results, would suggest continued 3rd generation cephalosporin treatment for 7-10 days as she has been clinically improved with ceftriaxone .

## 2024-10-06 NOTE — Plan of Care (Signed)

## 2024-10-06 NOTE — Discharge Summary (Signed)
 [facility] Physician Discharge Summary   Patient name: Jasmin Collins  Admit date:     10/04/2024  Discharge date: 10/06/2024  Attending Physician: LAURENCE, Meleny Tregoning [[id number]]  Discharge Physician: Camellia Laurence   PCP: Janna Ferrier, DO  Admitted From: Home  Disposition:  Home  Recommendations for Outpatient Follow-up:  Follow up with PCP in 1-2 weeks F/U with urology in 2 weeks. Dr. Renda Please follow up on the following pending results: blood and urine cx from 10-04-2024  Home Health:No Equipment/Devices: None    Discharge Condition:Stable CODE STATUS:FULL Diet recommendation: Heart Healthy/Diabetic Fluid Restriction: None  Hospital Summary: CC: left sided abd pain, gross hematuria HPI:  Jasmin Collins is a 58 y.o. female with medical history significant for anxiety, type 2 diabetes mellitus, GERD, and chronic low back pain who presents with left-sided abdominal pain, nausea, vomiting, gross hematuria, and chills.   Patient reports that she was in her usual state last night but this morning developed severe left-sided abdominal pain.  She went on to develop nausea with vomiting and severe, shaking chills.  She also noted gross hematuria.  She said evaluation at her PCP office was directed to the ED for expedited workup and treatment.   [facility] ED Course: In the ED, patient is found to be febrile, tachypneic, and tachycardic with stable BP.  Labs are most notable for normal creatinine, glucose 358, WBC 11,300, and hemoglobin 10.4.  CT demonstrates mild left hydronephrosis and proximal left hydroureter due to 6 x 14 mm proximal left ureteral stone.  Also noted on CT is perinephric fat stranding on the left.   Urology (Dr. Sherrilee) was consulted by the ED PA, blood and urine cultures were collected, and the patient was given 2 L of IVF, Zofran , Reglan, 2 g IV Rocephin , morphine, and acetaminophen .  She was transferred to [facility] for  admission.  Significant Events: Admitted 10/04/2024 for obstructive Left-sided pyelonephritis   Admission Labs: WBC 11.3, HgB 10.4, plt 274 UA brown and cloudy, spg 1.036, protein >300, negative LE, large LE, RBC >50, WBC >50 Na 135, K 4.2, CO2 of 23, BUN 13, Scr 0.84, glu 358 T prot 7.6, AST 31, ALT 50, alk phos 189, t. Bili 1.5 Lipase 30  Admission Imaging Studies: CT abd/pelvis Mild left hydronephrosis and proximal hydroureter due to a 6 x 14 mm proximal left ureteral stone slightly distal to the UPJ. Moderate left perinephric fat stranding. Additional punctate left lower pole intrarenal calculus. 2. Moderate hiatal hernia. 3. Uterine fibroids with at least two masses projecting over the endometrial stripe, largest 2 cm; recommend pelvic ultrasound to assess for submucosal versus endometrial origin. his may be performed non-emergently unless otherwise clinically indicated.  Significant Labs:   Significant Imaging Studies:   Antibiotic Therapy: Anti-infectives (From admission, onward)    Start     Dose/Rate Route Frequency Ordered Stop   10/05/24 1800  cefTRIAXone  (ROCEPHIN ) 2 g in sodium chloride  0.9 % 100 mL IVPB        2 g 200 mL/hr over 30 Minutes Intravenous Every 24 hours 10/05/24 0247 10/11/24 1759   10/04/24 1900  cefTRIAXone  (ROCEPHIN ) 1 g in sodium chloride  0.9 % 100 mL IVPB        1 g 200 mL/hr over 30 Minutes Intravenous  Once 10/04/24 1857 10/04/24 2042   10/04/24 1715  cefTRIAXone  (ROCEPHIN ) 1 g in sodium chloride  0.9 % 100 mL IVPB        1 g 200 mL/hr over  30 Minutes Intravenous  Once 10/04/24 1704 10/04/24 1750       Procedures:   Consultants: urology   Hospital Course by Problem: * Obstructive pyelonephritis 10/05/24 continue with IV antibiotics.  Repeat labs in the morning.  Waiting cultures.  10/06/24 WBC stable. Blood cx negative thus far. Change to po duricef 1000 mg bid x 12 days for treatment of pyelonephritis.    Encounter for fitting of  ureteral stent - placed 10-05-2024 10/05/24 Status post double-J stent placement with cystoscopy today.  Patient will follow-up with Dr. Renda in urology office.  10/06/24 f/u with Dr. Renda for kidney stone management    Sepsis due to urinary tract infection (HCC)-resolved as of 10/06/2024 10/05/24 present on admission.  Evidenced by white count 11.3, lactic acid of 3.9, heart rate of 106, respirations of 30.  Sepsis due to UTI.  Has acute organ dysfunction with elevated lactic acid but without septic shock.   Dilutional anemia 10/06/24 admit HgB of 11.3 g/dl. Today HgB of 8.5 g/dl. Pt has received 3 L IVF bolus on admission and then IVF at 125 ml/hr. I think her HgB of 8.5 is dilutional. She will need repeat CBC in PCP f/u appointment in 1-2 weeks. Send iron studies(Fe, TIBC, % sat, ferritin) as well.   AKI (acute kidney injury)-resolved as of 10/06/2024 10/05/24 due to pyelonephritis and obstructive uropathy.  Continue IV fluids.  Repeat chemistry in the morning.  10/06/24 Scr 0.67 today. Resolved.     Obesity, Class III, BMI 40-49.9 (morbid obesity) (HCC) Body mass index is 40.97 kg/m.   Kidney stone 10/05/24 will need definitive stone management as an outpatient.  10/06/24 f/u with Dr. Renda for kidney stone management   Type 2 diabetes mellitus without complication, without long-term current use of insulin (HCC) 10/05/24 continue with sliding scale insulin.  10/06/24 A1C of 8.1%. pt states she takes trucility at home. Recent uncontrolled DM with hyperglycemia due to stress from infection/kidney stone. F/u with PCP for DM management      Discharge Diagnoses:  Principal Problem:   Obstructive pyelonephritis Active Problems:   Encounter for fitting of ureteral stent - placed 10-05-2024   Dilutional anemia   Type 2 diabetes mellitus without complication, without long-term current use of insulin (HCC)   Kidney stone   Obesity, Class III, BMI 40-49.9 (morbid obesity)  (HCC)   Discharge Instructions  Discharge Instructions     Call MD for:  difficulty breathing, headache or visual disturbances   Complete by: As directed    Call MD for:  extreme fatigue   Complete by: As directed    Call MD for:  hives   Complete by: As directed    Call MD for:  persistant dizziness or light-headedness   Complete by: As directed    Call MD for:  persistant nausea and vomiting   Complete by: As directed    Call MD for:  redness, tenderness, or signs of infection (pain, swelling, redness, odor or green/yellow discharge around incision site)   Complete by: As directed    Call MD for:  severe uncontrolled pain   Complete by: As directed    Call MD for:  temperature >100.4   Complete by: As directed    Diet - low sodium heart healthy   Complete by: As directed    Diet Carb Modified   Complete by: As directed    Discharge instructions   Complete by: As directed    1. Follow up with your primary  care provider in 1-2 weeks following discharge from hospital. 2. Follow up with Dr. Renda with urology in 2 weeks. Call for appointment.   Increase activity slowly   Complete by: As directed       Allergies as of 10/06/2024   No Known Allergies      Medication List     STOP taking these medications    oxyCODONE -acetaminophen  10-325 MG tablet Commonly known as: PERCOCET   promethazine -dextromethorphan 6.25-15 MG/5ML syrup Commonly known as: PROMETHAZINE -DM       TAKE these medications    albuterol  108 (90 Base) MCG/ACT inhaler Commonly known as: VENTOLIN  HFA Inhale 2 puffs into the lungs every 6 (six) hours as needed for wheezing or shortness of breath.   atorvastatin  40 MG tablet Commonly known as: LIPITOR Take 1 tablet (40 mg total) by mouth daily.   Blood Glucose Monitoring Suppl Devi 1 each by Does not apply route as directed. Dispense based on patient and insurance preference. Use up to four times daily as directed. (FOR ICD-10 E10.9, E11.9).    BLOOD GLUCOSE TEST STRIPS Strp 1 each by Does not apply route as directed. Dispense based on patient and insurance preference. Use up to four times daily as directed. (FOR ICD-10 E10.9, E11.9).   cefadroxil 500 MG capsule Commonly known as: DURICEF Take 2 capsules (1,000 mg total) by mouth 2 (two) times daily for 12 days.   citalopram  40 MG tablet Commonly known as: CELEXA  TAKE 1 TABLET BY MOUTH EVERY DAY   hydrOXYzine  50 MG tablet Commonly known as: ATARAX  TAKE 1-2 TABLETS EVERY 6HRS AS NEEDED FOR ANXIETY What changed: See the new instructions.   Lancet Device Misc 1 each by Does not apply route as directed. Dispense based on patient and insurance preference. Use up to four times daily as directed. (FOR ICD-10 E10.9, E11.9).   Lancets Misc 1 each by Does not apply route as directed. Dispense based on patient and insurance preference. Use up to four times daily as directed. (FOR ICD-10 E10.9, E11.9).   ondansetron  4 MG tablet Commonly known as: ZOFRAN  Take 1 tablet (4 mg total) by mouth every 6 (six) hours as needed for nausea.   oxyCODONE  5 MG immediate release tablet Commonly known as: Oxy IR/ROXICODONE  Take 1 tablet (5 mg total) by mouth every 6 (six) hours as needed for up to 3 days for severe pain (pain score 7-10) or moderate pain (pain score 4-6).   pantoprazole  40 MG tablet Commonly known as: PROTONIX  TAKE 1 TABLET(40 MG) BY MOUTH DAILY What changed: See the new instructions.   polyethylene glycol 17 g packet Commonly known as: MIRALAX / GLYCOLAX Take 17 g by mouth daily as needed for mild constipation.   Trulicity  3 MG/0.5ML Soaj Generic drug: Dulaglutide  INJECT 3 MG AS DIRECTED ONCE A WEEK. What changed: when to take this        Follow-up Information     Renda Glance, MD. Schedule an appointment as soon as possible for a visit in 2 week(s).   Specialty: Urology Contact information: [address] [place] [address] [phone]                 No Known Allergies  Discharge Exam: Vitals:   10/06/24 0219 10/06/24 0603  BP: (!) 142/79 137/82  Pulse: 84 91  Resp: 20 19  Temp: 98.1 F (36.7 C) 98 F (36.7 C)  SpO2: 99% 98%    Physical Exam Vitals and nursing note reviewed.  Constitutional:  General: She is not in acute distress.    Appearance: She is obese. She is not toxic-appearing.  HENT:     Head: Normocephalic and atraumatic.     Nose: Nose normal.  Eyes:     General: No scleral icterus. Pulmonary:     Effort: No respiratory distress.  Abdominal:     General: There is no distension.  Neurological:     General: No focal deficit present.     Mental Status: She is alert and oriented to person, place, and time.     The results of significant diagnostics from this hospitalization (including imaging, microbiology, ancillary and laboratory) are listed below for reference.    Microbiology: Recent Results (from the past 240 hours)  Urine Culture     Status: Abnormal   Collection Time: 10/04/24  3:28 PM   Specimen: Urine, Clean Catch  Result Value Ref Range Status   Specimen Description   Final    URINE, CLEAN CATCH Performed at [facility], [address], [place], [place] [address]    Special Requests   Final    NONE Performed at [facility], [address], [place], [place] [address]    Culture MULTIPLE SPECIES PRESENT, SUGGEST RECOLLECTION (A)  Final   Report Status 10/06/2024 FINAL  Final  Blood culture (routine x 2)     Status: None (Preliminary result)   Collection Time: 10/04/24  8:30 PM   Specimen: BLOOD  Result Value Ref Range Status   Specimen Description   Final    BLOOD LEFT ANTECUBITAL Performed at [facility], [address] [place], [place] [address]    Special Requests   Final    BOTTLES DRAWN AEROBIC AND ANAEROBIC Blood Culture adequate volume Performed at [facility], [address],  [place], [place] [address]    Culture   Final    NO GROWTH 2 DAYS Performed at [facility], [address] [place], [place] [address]    Report Status PENDING  Incomplete  Blood culture (routine x 2)     Status: None (Preliminary result)   Collection Time: 10/04/24  8:33 PM   Specimen: BLOOD LEFT FOREARM  Result Value Ref Range Status   Specimen Description   Final    BLOOD LEFT FOREARM Performed at [facility], [address], [place], [place] [address]    Special Requests   Final    BOTTLES DRAWN AEROBIC AND ANAEROBIC Blood Culture adequate volume Performed at [facility], [address] [place], [place] [address]    Culture   Final    NO GROWTH 2 DAYS Performed at [facility], [address] [place], [place] [address]    Report Status PENDING  Incomplete  Surgical pcr screen     Status: Abnormal   Collection Time: 10/05/24 11:57 AM   Specimen: Nasal Mucosa; Nasal Swab  Result Value Ref Range Status   MRSA, PCR POSITIVE (A) NEGATIVE Final    Comment: RESULT CALLED TO, READ BACK BY AND VERIFIED WITH:  FU, C 10/05/2024 1406 AJ    Staphylococcus aureus POSITIVE (A) NEGATIVE Final    Comment: (NOTE) The Xpert SA Assay (FDA approved for NASAL specimens in patients 63 years of age and older), is one component of a comprehensive surveillance program. It is not intended to diagnose infection nor to guide or monitor treatment. Performed at [facility], [address] [place], [place] [address]  Labs:  Basic Metabolic Panel: Recent Labs  Lab 10/04/24 1528 10/05/24 0420 10/06/24 0420  NA 135 137 135  K 4.2 4.2 4.6  CL 100 104 103  CO2 23 23 23   GLUCOSE 358* 367* 431*  BUN 13 17 16   CREATININE 0.84 1.45* 0.67  CALCIUM  9.7 8.5* 8.7*   Liver Function Tests: Recent Labs  Lab 10/04/24 1528 10/05/24 0420 10/06/24 0420  AST 31 44* 25  ALT 50* 42 35  ALKPHOS 189* 128* 138*  BILITOT 1.5* 1.2 0.7  PROT  7.6 6.0* 6.4*  ALBUMIN 4.2 3.1* 3.1*   Recent Labs  Lab 10/04/24 1528  LIPASE 30    CBC: Recent Labs  Lab 10/04/24 1528 10/05/24 0420 10/06/24 0420  WBC 11.3* 17.8* 16.9*  NEUTROABS  --   --  15.5*  HGB 10.4* 8.4* 8.5*  HCT 32.8* 27.1* 27.6*  MCV 83.5 86.6 87.1  PLT 274 190 184   CBG: Recent Labs  Lab 10/05/24 0906 10/05/24 1119 10/05/24 1700 10/05/24 2158 10/06/24 0728  GLUCAP 286* 170* 350* 399* 490*   Hgb A1c Recent Labs    10/05/24 0420  HGBA1C 8.1*   Urinalysis    Component Value Date/Time   COLORURINE BROWN (A) 10/04/2024 1528   APPEARANCEUR CLOUDY (A) 10/04/2024 1528   LABSPEC 1.036 (H) 10/04/2024 1528   PHURINE 6.0 10/04/2024 1528   GLUCOSEU 250 (A) 10/04/2024 1528   HGBUR LARGE (A) 10/04/2024 1528   BILIRUBINUR NEGATIVE 10/04/2024 1528   KETONESUR 15 (A) 10/04/2024 1528   PROTEINUR >300 (A) 10/04/2024 1528   UROBILINOGEN 0.2 07/26/2018 0901   NITRITE NEGATIVE 10/04/2024 1528   LEUKOCYTESUR LARGE (A) 10/04/2024 1528   Sepsis Labs Recent Labs  Lab 10/04/24 1528 10/05/24 0420 10/06/24 0420  WBC 11.3* 17.8* 16.9*    Procedures/Studies: DG C-Arm 1-60 Min-No Report Result Date: 10/05/2024 Fluoroscopy was utilized by the requesting physician.  No radiographic interpretation.   CT ABDOMEN PELVIS W CONTRAST Result Date: 10/04/2024 EXAM: CT ABDOMEN AND PELVIS WITH CONTRAST 10/04/2024 06:35:51 PM TECHNIQUE: CT of the abdomen and pelvis was performed with the administration of 100 mL of iohexol  (OMNIPAQUE ) 300 MG/ML solution. Multiplanar reformatted images are provided for review. Automated exposure control, iterative reconstruction, and/or weight-based adjustment of the mA/kV was utilized to reduce the radiation dose to as low as reasonably achievable. COMPARISON: None available. CLINICAL HISTORY: Abdominal pain, acute, nonlocalized. FINDINGS: LOWER CHEST: Moderate hiatal hernia. LIVER: Hepatic steatosis. Subcentimeter hypodensities within the  inferior right hepatic lobe too small to further characterize. GALLBLADDER AND BILE DUCTS: Gallbladder is unremarkable. No biliary ductal dilatation. SPLEEN: No acute abnormality. PANCREAS: No acute abnormality. ADRENAL GLANDS: No acute abnormality. KIDNEYS, URETERS AND BLADDER: Mild left hydronephrosis and proximal hydroureter secondary to a 6 x 14 mm stone in the proximal left ureter slightly distal to the UPJ. Moderate left perinephric fat stranding. Punctate intrarenal calculus in the lower pole of the left kidney. Urinary bladder is unremarkable. GI AND BOWEL: Stomach demonstrates no acute abnormality. There is no bowel obstruction. PERITONEUM AND RETROPERITONEUM: No ascites. No free air. VASCULATURE: Aorta is normal in caliber. LYMPH NODES: No lymphadenopathy. REPRODUCTIVE ORGANS: Uterine masses consistent with fibroids. At least two masses project over the endometrial stripe, the larger measuring 2 cm. Series 5, image 75. These are indeterminate for submucosal masses or endometrial masses. BONES AND SOFT TISSUES: No acute osseous abnormality. No focal soft tissue abnormality. IMPRESSION: 1. Mild left hydronephrosis and proximal hydroureter due to a 6 x 14 mm proximal left  ureteral stone slightly distal to the UPJ. Moderate left perinephric fat stranding. Additional punctate left lower pole intrarenal calculus. 2. Moderate hiatal hernia. 3. Uterine fibroids with at least two masses projecting over the endometrial stripe, largest 2 cm; recommend pelvic ultrasound to assess for submucosal versus endometrial origin. his may be performed non-emergently unless otherwise clinically indicated. Electronically signed by: Luke Bun MD 10/04/2024 06:43 PM EST RP Workstation: [id number]    Time coordinating discharge: 60 mins  SIGNED:  Camellia Door, DO [facility] 10/06/24, 8:55 AM

## 2024-10-06 NOTE — Assessment & Plan Note (Signed)
 10/06/24 admit HgB of 11.3 g/dl. Today HgB of 8.5 g/dl. Pt has received 3 L IVF bolus on admission and then IVF at 125 ml/hr. I think her HgB of 8.5 is dilutional. She will need repeat CBC in PCP f/u appointment in 1-2 weeks. Send iron studies(Fe, TIBC, % sat, ferritin) as well.

## 2024-10-06 NOTE — Progress Notes (Signed)
 PROGRESS NOTE    SMT. LODER  FMW:[id number] DOB: [birth year] DOA: 10/04/2024 PCP: Janna Ferrier, DO  Subjective: Pt seen and examined. Feeling much better. Little to no pain. Still feels fullness in left flank. Discussed that she will continue to have this sensation until ureteral stent is removed.   Hospital Course: CC: left sided abd pain, gross hematuria HPI:  Jasmin Collins is a 58 y.o. female with medical history significant for anxiety, type 2 diabetes mellitus, GERD, and chronic low back pain who presents with left-sided abdominal pain, nausea, vomiting, gross hematuria, and chills.   Patient reports that she was in her usual state last night but this morning developed severe left-sided abdominal pain.  She went on to develop nausea with vomiting and severe, shaking chills.  She also noted gross hematuria.  She said evaluation at her PCP office was directed to the ED for expedited workup and treatment.   [facility] ED Course: In the ED, patient is found to be febrile, tachypneic, and tachycardic with stable BP.  Labs are most notable for normal creatinine, glucose 358, WBC 11,300, and hemoglobin 10.4.  CT demonstrates mild left hydronephrosis and proximal left hydroureter due to 6 x 14 mm proximal left ureteral stone.  Also noted on CT is perinephric fat stranding on the left.   Urology (Dr. Sherrilee) was consulted by the ED PA, blood and urine cultures were collected, and the patient was given 2 L of IVF, Zofran , Reglan, 2 g IV Rocephin , morphine, and acetaminophen .  She was transferred to [facility] for admission.  Significant Events: Admitted 10/04/2024 for obstructive Left-sided pyelonephritis   Admission Labs: WBC 11.3, HgB 10.4, plt 274 UA brown and cloudy, spg 1.036, protein >300, negative LE, large LE, RBC >50, WBC >50 Na 135, K 4.2, CO2 of 23, BUN 13, Scr 0.84, glu 358 T prot 7.6, AST 31, ALT 50, alk phos 189, t. Bili 1.5 Lipase  30  Admission Imaging Studies: CT abd/pelvis Mild left hydronephrosis and proximal hydroureter due to a 6 x 14 mm proximal left ureteral stone slightly distal to the UPJ. Moderate left perinephric fat stranding. Additional punctate left lower pole intrarenal calculus. 2. Moderate hiatal hernia. 3. Uterine fibroids with at least two masses projecting over the endometrial stripe, largest 2 cm; recommend pelvic ultrasound to assess for submucosal versus endometrial origin. his may be performed non-emergently unless otherwise clinically indicated.  Significant Labs:   Significant Imaging Studies:   Antibiotic Therapy: Anti-infectives (From admission, onward)    Start     Dose/Rate Route Frequency Ordered Stop   10/05/24 1800  cefTRIAXone  (ROCEPHIN ) 2 g in sodium chloride  0.9 % 100 mL IVPB        2 g 200 mL/hr over 30 Minutes Intravenous Every 24 hours 10/05/24 0247 10/11/24 1759   10/04/24 1900  cefTRIAXone  (ROCEPHIN ) 1 g in sodium chloride  0.9 % 100 mL IVPB        1 g 200 mL/hr over 30 Minutes Intravenous  Once 10/04/24 1857 10/04/24 2042   10/04/24 1715  cefTRIAXone  (ROCEPHIN ) 1 g in sodium chloride  0.9 % 100 mL IVPB        1 g 200 mL/hr over 30 Minutes Intravenous  Once 10/04/24 1704 10/04/24 1750       Procedures:   Consultants: urology    Assessment and Plan: * Obstructive pyelonephritis 10/05/24 continue with IV antibiotics.  Repeat labs in the morning.  Waiting cultures.  10/06/24 WBC stable. Blood cx negative thus  far. Change to po duricef 1000 mg bid x 12 days for treatment of pyelonephritis.    Encounter for fitting of ureteral stent - placed 10-05-2024 10/05/24 Status post double-J stent placement with cystoscopy today.  Patient will follow-up with Dr. Renda in urology office.  10/06/24 f/u with Dr. Renda for kidney stone management    Sepsis due to urinary tract infection (HCC)-resolved as of 10/06/2024 10/05/24 present on admission.  Evidenced by white  count 11.3, lactic acid of 3.9, heart rate of 106, respirations of 30.  Sepsis due to UTI.  Has acute organ dysfunction with elevated lactic acid but without septic shock.   Dilutional anemia 10/06/24 admit HgB of 11.3 g/dl. Today HgB of 8.5 g/dl. Pt has received 3 L IVF bolus on admission and then IVF at 125 ml/hr. I think her HgB of 8.5 is dilutional. She will need repeat CBC in PCP f/u appointment in 1-2 weeks. Send iron studies(Fe, TIBC, % sat, ferritin) as well.   AKI (acute kidney injury)-resolved as of 10/06/2024 10/05/24 due to pyelonephritis and obstructive uropathy.  Continue IV fluids.  Repeat chemistry in the morning.  10/06/24 Scr 0.67 today. Resolved.     Obesity, Class III, BMI 40-49.9 (morbid obesity) (HCC) Body mass index is 40.97 kg/m.   Kidney stone 10/05/24 will need definitive stone management as an outpatient.  10/06/24 f/u with Dr. Renda for kidney stone management   Type 2 diabetes mellitus without complication, without long-term current use of insulin (HCC) 10/05/24 continue with sliding scale insulin.  10/06/24 A1C of 8.1%. pt states she takes trucility at home. Recent uncontrolled DM with hyperglycemia due to stress from infection/kidney stone. F/u with PCP for DM management    DVT prophylaxis: SCDs Start: 10/05/24 0246    Code Status: Full Code Family Communication: pt is decisional. No family at bedside Disposition Plan: return home Reason for continuing need for hospitalization: stable for DC.  Objective: Vitals:   10/05/24 1815 10/05/24 2201 10/06/24 0219 10/06/24 0603  BP: 119/66 116/74 (!) 142/79 137/82  Pulse: 97 88 84 91  Resp: 20 20 20 19   Temp: 98 F (36.7 C) 98.6 F (37 C) 98.1 F (36.7 C) 98 F (36.7 C)  TempSrc: Oral Oral Oral Oral  SpO2: 99% 100% 99% 98%  Weight:      Height:        Intake/Output Summary (Last 24 hours) at 10/06/2024 0844 Last data filed at 10/06/2024 0800 Gross per 24 hour  Intake 1391.67 ml  Output  701 ml  Net 690.67 ml   Filed Weights   10/05/24 0332  Weight: 101.6 kg    Examination:  Physical Exam Vitals and nursing note reviewed.  Constitutional:      General: She is not in acute distress.    Appearance: She is obese. She is not toxic-appearing.  HENT:     Head: Normocephalic and atraumatic.     Nose: Nose normal.  Eyes:     General: No scleral icterus. Pulmonary:     Effort: No respiratory distress.  Abdominal:     General: There is no distension.  Neurological:     General: No focal deficit present.     Mental Status: She is alert and oriented to person, place, and time.     Data Reviewed: I have personally reviewed following labs and imaging studies  CBC: Recent Labs  Lab 10/04/24 1528 10/05/24 0420 10/06/24 0420  WBC 11.3* 17.8* 16.9*  NEUTROABS  --   --  15.5*  HGB 10.4* 8.4* 8.5*  HCT 32.8* 27.1* 27.6*  MCV 83.5 86.6 87.1  PLT 274 190 184   Basic Metabolic Panel: Recent Labs  Lab 10/04/24 1528 10/05/24 0420 10/06/24 0420  NA 135 137 135  K 4.2 4.2 4.6  CL 100 104 103  CO2 23 23 23   GLUCOSE 358* 367* 431*  BUN 13 17 16   CREATININE 0.84 1.45* 0.67  CALCIUM  9.7 8.5* 8.7*   GFR: Estimated Creatinine Clearance: 87.6 mL/min (by C-G formula based on SCr of 0.67 mg/dL). Liver Function Tests: Recent Labs  Lab 10/04/24 1528 10/05/24 0420 10/06/24 0420  AST 31 44* 25  ALT 50* 42 35  ALKPHOS 189* 128* 138*  BILITOT 1.5* 1.2 0.7  PROT 7.6 6.0* 6.4*  ALBUMIN 4.2 3.1* 3.1*   Recent Labs  Lab 10/04/24 1528  LIPASE 30    Coagulation Profile: Recent Labs  Lab 10/05/24 0420  INR 1.2   HbA1C: Recent Labs    10/05/24 0420  HGBA1C 8.1*   CBG: Recent Labs  Lab 10/05/24 0906 10/05/24 1119 10/05/24 1700 10/05/24 2158 10/06/24 0728  GLUCAP 286* 170* 350* 399* 490*   Sepsis Labs: Recent Labs  Lab 10/05/24 0420 10/05/24 0731  LATICACIDVEN 3.9* 3.9*    Recent Results (from the past 240 hours)  Blood culture (routine x  2)     Status: None (Preliminary result)   Collection Time: 10/04/24  8:30 PM   Specimen: BLOOD  Result Value Ref Range Status   Specimen Description   Final    BLOOD LEFT ANTECUBITAL Performed at [facility], [address], [place], [place] [address]    Special Requests   Final    BOTTLES DRAWN AEROBIC AND ANAEROBIC Blood Culture adequate volume Performed at [facility], [address] [place], [place] [address]    Culture   Final    NO GROWTH 2 DAYS Performed at [facility], [address] [place], [place] [address]    Report Status PENDING  Incomplete  Blood culture (routine x 2)     Status: None (Preliminary result)   Collection Time: 10/04/24  8:33 PM   Specimen: BLOOD LEFT FOREARM  Result Value Ref Range Status   Specimen Description   Final    BLOOD LEFT FOREARM Performed at [facility], [address], [place], [place] [address]    Special Requests   Final    BOTTLES DRAWN AEROBIC AND ANAEROBIC Blood Culture adequate volume Performed at [facility], [address], [place], [place] [address]    Culture   Final    NO GROWTH 2 DAYS Performed at [facility], [address] [place], [place] [address]    Report Status PENDING  Incomplete  Surgical pcr screen     Status: Abnormal   Collection Time: 10/05/24 11:57 AM   Specimen: Nasal Mucosa; Nasal Swab  Result Value Ref Range Status   MRSA, PCR POSITIVE (A) NEGATIVE Final    Comment: RESULT CALLED TO, READ BACK BY AND VERIFIED WITH:  FU, C 10/05/2024 1406 AJ    Staphylococcus aureus POSITIVE (A) NEGATIVE Final    Comment: (NOTE) The Xpert SA Assay (FDA approved for NASAL specimens in patients 18 years of age and older), is one component of a comprehensive surveillance program. It is not intended to diagnose infection nor to guide or monitor treatment. Performed at [facility], [address], [place],  [place] [address]  Radiology Studies: DG C-Arm 1-60 Min-No Report Result Date: 10/05/2024 Fluoroscopy was utilized by the requesting physician.  No radiographic interpretation.   CT ABDOMEN PELVIS W CONTRAST Result Date: 10/04/2024 EXAM: CT ABDOMEN AND PELVIS WITH CONTRAST 10/04/2024 06:35:51 PM TECHNIQUE: CT of the abdomen and pelvis was performed with the administration of 100 mL of iohexol  (OMNIPAQUE ) 300 MG/ML solution. Multiplanar reformatted images are provided for review. Automated exposure control, iterative reconstruction, and/or weight-based adjustment of the mA/kV was utilized to reduce the radiation dose to as low as reasonably achievable. COMPARISON: None available. CLINICAL HISTORY: Abdominal pain, acute, nonlocalized. FINDINGS: LOWER CHEST: Moderate hiatal hernia. LIVER: Hepatic steatosis. Subcentimeter hypodensities within the inferior right hepatic lobe too small to further characterize. GALLBLADDER AND BILE DUCTS: Gallbladder is unremarkable. No biliary ductal dilatation. SPLEEN: No acute abnormality. PANCREAS: No acute abnormality. ADRENAL GLANDS: No acute abnormality. KIDNEYS, URETERS AND BLADDER: Mild left hydronephrosis and proximal hydroureter secondary to a 6 x 14 mm stone in the proximal left ureter slightly distal to the UPJ. Moderate left perinephric fat stranding. Punctate intrarenal calculus in the lower pole of the left kidney. Urinary bladder is unremarkable. GI AND BOWEL: Stomach demonstrates no acute abnormality. There is no bowel obstruction. PERITONEUM AND RETROPERITONEUM: No ascites. No free air. VASCULATURE: Aorta is normal in caliber. LYMPH NODES: No lymphadenopathy. REPRODUCTIVE ORGANS: Uterine masses consistent with fibroids. At least two masses project over the endometrial stripe, the larger measuring 2 cm. Series 5, image 75. These are indeterminate for submucosal masses or endometrial masses. BONES AND SOFT TISSUES: No acute osseous abnormality. No focal soft tissue  abnormality. IMPRESSION: 1. Mild left hydronephrosis and proximal hydroureter due to a 6 x 14 mm proximal left ureteral stone slightly distal to the UPJ. Moderate left perinephric fat stranding. Additional punctate left lower pole intrarenal calculus. 2. Moderate hiatal hernia. 3. Uterine fibroids with at least two masses projecting over the endometrial stripe, largest 2 cm; recommend pelvic ultrasound to assess for submucosal versus endometrial origin. his may be performed non-emergently unless otherwise clinically indicated. Electronically signed by: Luke Bun MD 10/04/2024 06:43 PM EST RP Workstation: [id number]    Scheduled Meds:  atorvastatin   40 mg Oral Daily   Chlorhexidine Gluconate Cloth  6 each Topical Daily   citalopram   40 mg Oral Daily   insulin aspart  0-20 Units Subcutaneous TID WC   insulin aspart  0-5 Units Subcutaneous QHS   insulin glargine-yfgn  15 Units Subcutaneous Daily   mupirocin ointment  1 Application Nasal BID   pantoprazole   40 mg Oral Daily   sodium chloride  flush  3 mL Intravenous Q12H   Continuous Infusions:  cefTRIAXone  (ROCEPHIN )  IV 2 g (10/05/24 1704)     LOS: 1 day   Time spent: 55 minutes  Camellia Door, DO  [facility]  10/06/2024, 8:44 AM

## 2024-10-07 ENCOUNTER — Ambulatory Visit (INDEPENDENT_AMBULATORY_CARE_PROVIDER_SITE_OTHER)

## 2024-10-07 ENCOUNTER — Ambulatory Visit: Admitting: Family Medicine

## 2024-10-07 ENCOUNTER — Other Ambulatory Visit (HOSPITAL_COMMUNITY): Payer: Self-pay

## 2024-10-07 ENCOUNTER — Other Ambulatory Visit: Payer: Self-pay | Admitting: Urology

## 2024-10-07 VITALS — BP 130/78 | HR 88 | Ht 62.0 in | Wt 251.8 lb

## 2024-10-07 DIAGNOSIS — E119 Type 2 diabetes mellitus without complications: Secondary | ICD-10-CM

## 2024-10-07 DIAGNOSIS — N111 Chronic obstructive pyelonephritis: Secondary | ICD-10-CM | POA: Diagnosis present

## 2024-10-07 DIAGNOSIS — N200 Calculus of kidney: Secondary | ICD-10-CM

## 2024-10-07 DIAGNOSIS — J208 Acute bronchitis due to other specified organisms: Secondary | ICD-10-CM | POA: Diagnosis not present

## 2024-10-07 MED ORDER — ALBUTEROL SULFATE HFA 108 (90 BASE) MCG/ACT IN AERS: 2.0000 | INHALATION_SPRAY | Freq: Four times a day (QID) | RESPIRATORY_TRACT | 0 refills | Status: DC | PRN

## 2024-10-07 MED ORDER — BASAGLAR KWIKPEN 100 UNIT/ML ~~LOC~~ SOPN: 10.0000 [IU] | PEN_INJECTOR | SUBCUTANEOUS | 0 refills | Status: AC

## 2024-10-07 MED ORDER — INSULIN GLARGINE 100 UNIT/ML SOLOSTAR PEN: 10.0000 [IU] | PEN_INJECTOR | Freq: Every day | SUBCUTANEOUS | 2 refills | Status: AC

## 2024-10-07 MED ORDER — BD PEN NEEDLE MINI U/F 31G X 5 MM MISC: 0 refills | Status: AC

## 2024-10-07 NOTE — Assessment & Plan Note (Signed)
 Vital signs stable today Patient on a 12-day course of cefadroxil, has been adherent Urology follow-up with Dr. Renda on 11/12

## 2024-10-07 NOTE — Assessment & Plan Note (Signed)
 Endorses high blood sugars when checking at home, as well as polyuria and polydipsia Continue Trulicity  Start 10 units of long-acting insulin every morning Counseled on appropriate hydration and nutrition strategies to avoid acute hyperglycemia episodes quiring further hospitalization

## 2024-10-07 NOTE — Progress Notes (Deleted)
     SUBJECTIVE:   CHIEF COMPLAINT / HPI:   Jasmin Collins presents today for hospital follow up.   Hospitalized at [facility] from 11/4 to 11/6, for L sided pyelonephritis.  Prior to arrival patient was having nausea with vomiting and severe shaking chills along with gross hematuria.  She was evaluated in the office and was advised to go to the ED, [facility] and was [facility] up.  Labs were notable for leukocytosis 11,300 and CT revealed mild left-sided hydronephrosis and proximal left hydroureter due to 6 x 14 mm proximal left ureteral stone and perinephric fat stranding on the left.  Urology was consulted and patient was started on IV antibiotics and transferred to [facility].  Patient underwent double-J stent placement with cystoscopy on 11/5 and is expected to follow-up with urology outpatient with Dr. Renda for kidney stone management.  Urine culture demonstrated multiple species and recommended recollection.  However no repeat culture was performed and patient was transitioned to oral abx with Cefadroxil 1000 mg BID for 12 days.  NGTD on blood culture x 3 days.  Since discharge, patient reports ***  PERTINENT  PMH / PSH: Anxiety, diabetes, GERD  OBJECTIVE:   LMP  (LMP Unknown)   General: Awake and Alert in NAD HEENT: NCAT. Sclera anicteric. No rhinorrhea. Cardiovascular: RRR. No M/R/G Respiratory: CTAB, normal WOB on RA. No wheezing, crackles, rhonchi, or diminished breath sounds. Abdomen: Soft, non-tender, non-distended. Bowel sounds normoactive/hypoactive/hyperactive. *** Extremities: Able to move all extremities. No BLE edema, no deformities or significant joint findings. Skin: Warm and dry. No abrasions or rashes noted. Neuro: A&Ox***. No focal neurological deficits.  {Simple Foot Exam:25261::Diabetic foot exam was performed. ,No deformities or other abnormal visual findings. ,Posterior tibialis and dorsalis pulse intact bilaterally. ,Intact  to touch and monofilament testing bilaterally. }   ASSESSMENT/PLAN:   Assessment & Plan      Jasmin Melena, DO [facility]

## 2024-10-07 NOTE — Progress Notes (Signed)
 SUBJECTIVE:   CHIEF COMPLAINT / HPI:   Jasmin Collins presents today for hospital follow up.   Hospitalized at [facility] from 11/4 to 11/6, for L sided pyelonephritis.  Prior to arrival patient was having nausea with vomiting and severe shaking chills along with gross hematuria.  She was evaluated in the office and was advised to go to the ED, [facility] and was worked up.  Labs were notable for leukocytosis 11,300 and CT revealed mild left-sided hydronephrosis and proximal left hydroureter due to 6 x 14 mm proximal left ureteral stone and perinephric fat stranding on the left.  Urology was consulted and patient was started on IV antibiotics and transferred to [facility].  Patient underwent double-J stent placement with cystoscopy on 11/5 and is expected to follow-up with urology outpatient with Dr. Renda for kidney stone management.  Urine culture demonstrated multiple species and recommended recollection.  However no repeat culture was performed and patient was transitioned to oral abx with Cefadroxil 1000 mg BID for 12 days.  NGTD on blood culture x 3 days.  Since discharge, patient reports some continued groin discomfort, dysuria and mild hematuria but reports these symptoms are gradually improving. She endorses taking her antibiotics as instructed. She plans on attending her f/u appt w/ Dr Renda on the 12th and the 17th (removal of the stent).   Patient reports she was told in the hospital that she has type 2 diabetes.  She was being treated with insulin inpatient, but was still having elevated blood sugars into the 300s and 400s.  Since discharge, she endorses polyuria and polydipsia.  She has been checking her blood sugars at home and they remain elevated in the 300s.  Discussed that these elevated blood sugars could be partially attributed to sepsis in the setting of her recent infection. Patient is currently taking Trulicity , would like to initiate insulin at  home.  In-depth discussion of adequate hydration and dietary strategies for avoiding further significant hyperglycemia.  Provided training on insulin administration.  PERTINENT  PMH / PSH: Anxiety, diabetes, GERD  OBJECTIVE:   BP 130/78   Pulse 88   Ht 5' 2 (1.575 m)   Wt 251 lb 12.8 oz (114.2 kg)   LMP  (LMP Unknown)   SpO2 98%   BMI 46.05 kg/m   General: Awake, alert, NAD. Communicates clearly. Cardio: RRR. Normal S1, S2. No murmur, rub, gallop. 2+ radial and dorsalis pedis pulses b/l w/ good capillary refill.  Resp: CTA bilaterally. No wheezes, rales, or rhonchi. Normal work of breathing on room air.  Abdomen: soft, non-tender, non-distended. Normoactive BS auscultated. No guarding, rigidity, or rebound. Negative Murphy's. No tenderness at McBurney's point. Negative CVA tenderness.   ASSESSMENT/PLAN:   Assessment & Plan Obstructive pyelonephritis Kidney stone Vital signs stable today Patient on a 12-day course of cefadroxil, has been adherent Urology follow-up with Dr. Renda on 11/12 Type 2 diabetes mellitus without complication, without long-term current use of insulin (HCC) Endorses high blood sugars when checking at home, as well as polyuria and polydipsia Continue Trulicity  Start 10 units of long-acting insulin every morning Counseled on appropriate hydration and nutrition strategies to avoid acute hyperglycemia episodes quiring further hospitalization Viral bronchitis Patient endorses coughing throughout her hospital admission and since discharge. Reports previously using albuterol  as needed during similar episodes, including throughout hospitalization, refill sent to pharmacy for use as needed   Return to clinic Monday with Dr. Manon  Leafy Manon, DO [facility]

## 2024-10-07 NOTE — Patient Instructions (Signed)
 It was great to see you today!  I'm glad you got treatment for your infection, and I'm sorry you were sick enough that you had to be in the hospital!   Please attend those follow up appointments with Dr Renda and continue to take your antibiotics until they are complete, as those are the most important steps for completely fighting off this kidney infection.   Your blood sugars are quite high recently! This can be common when you have a severe infection like you did. To avoid having to go back to the hospital due to complications from high blood sugar we're going to start you on 10 units of long acting insulin in the morning.   Please follow up with Dr Janna in 1 week for monitoring of your sugars.   Thank you for choosing [facility].   Please call [phone] with any questions about today's appointment.  Leafy Scriver, DO Family Medicine

## 2024-10-09 LAB — CULTURE, BLOOD (ROUTINE X 2)
Culture: NO GROWTH
Culture: NO GROWTH
Special Requests: ADEQUATE
Special Requests: ADEQUATE

## 2024-10-10 ENCOUNTER — Other Ambulatory Visit (HOSPITAL_COMMUNITY): Payer: Self-pay

## 2024-10-10 ENCOUNTER — Ambulatory Visit (INDEPENDENT_AMBULATORY_CARE_PROVIDER_SITE_OTHER): Payer: Self-pay

## 2024-10-10 VITALS — BP 135/76 | HR 82 | Ht 62.0 in | Wt 243.4 lb

## 2024-10-10 DIAGNOSIS — E119 Type 2 diabetes mellitus without complications: Secondary | ICD-10-CM

## 2024-10-10 NOTE — Progress Notes (Signed)
    SUBJECTIVE:   CHIEF COMPLAINT / HPI:   Patient presents today for close follow-up of her type 2 diabetes.  Patient has been taking 10 units of long-acting insulin daily since last Friday.  She reports her blood sugars are slowly improving, her morning CBG was about 200 today.  She is feeling much better since her last visit and is happy that she started on insulin.  She reports her nocturia is decreasing over time.  She has follow-up with her urologist for management of her pyelo and ureteral stent coming up on Wednesday.  Her stent removal is a week from today.  She continues to take her antibiotics 3 times daily.  She still has some pressure with urination, but no remaining hematuria.  Discussed strategy for titrating of her home insulin at length.  Instructed her to check her sugar each morning before breakfast and keep a diary.  PERTINENT  PMH / PSH: Pyelonephritis with ureteral stent placement, type 2 diabetes  OBJECTIVE:   BP 135/76   Pulse 82   Ht 5' 2 (1.575 m)   Wt 243 lb 6.4 oz (110.4 kg)   LMP  (LMP Unknown)   SpO2 98%   BMI 44.52 kg/m    General: NAD, well appearing Neuro: A&O Respiratory: normal WOB on RA Extremities: Moving all 4 extremities equally   ASSESSMENT/PLAN:   Assessment & Plan Type 2 diabetes mellitus without complication, without long-term current use of insulin (HCC) Daily AM blood sugar checks Continue trulicity  3mg  weekly Continue 10 units of long-acting insulin daily Can titrate up by 2 units every 3 to 4 days if blood sugars remain above 160.  Stop when sugars are in the 120-160 range, and decrease dose if any home blood sugars are below 120. Patient verbalized understanding and agrees w/ plan.  RTC in 2 weeks w/ PCP Janna Leafy Scriver, DO [facility]

## 2024-10-10 NOTE — Patient Instructions (Signed)
 SURGICAL WAITING ROOM VISITATION  Patients having surgery or a procedure may have no more than 2 support people in the waiting area - these visitors may rotate.    Children under the age of 41 must have an adult with them who is not the patient.  Visitors with respiratory illnesses are discouraged from visiting and should remain at home.  If the patient needs to stay at the hospital during part of their recovery, the visitor guidelines for inpatient rooms apply. Pre-op nurse will coordinate an appropriate time for 1 support person to accompany patient in pre-op.  This support person may not rotate.    Please refer to the [facility] website for the visitor guidelines for Inpatients (after your surgery is over and you are in a regular room).       Your procedure is scheduled on: 10/17/24   Report to [facility] Main Entrance    Report to admitting at 8:45 AM   Call this number if you have problems the morning of surgery [phone]   Do not eat food or drink liquids :After Midnight.but may have sips of water to take meds.        Oral Hygiene is also important to reduce your risk of infection.                                    Remember - BRUSH YOUR TEETH THE MORNING OF SURGERY WITH YOUR REGULAR TOOTHPASTE   Stop all vitamins and herbal supplements 7 days before surgery.   Take these medicines the morning of surgery with A SIP OF WATER: citalopram (celexa ), hydroxyzine , zofran , pantoprazole , inhaler  DO NOT TAKE ANY ORAL DIABETIC MEDICATIONS DAY OF YOUR SURGERY             You may not have any metal on your body including hair pins, jewelry, and body piercing             Do not wear make-up, lotions, powders, perfumes/cologne, or deodorant  Do not wear nail polish including gel and S&S, artificial/acrylic nails, or any other type of covering on natural nails including finger and toenails. If you have artificial nails, gel coating, etc. that needs to be removed by a nail  salon please have this removed prior to surgery or surgery may need to be canceled/ delayed if the surgeon/ anesthesia feels like they are unable to be safely monitored.   Do not shave  48 hours prior to surgery.              Do not bring valuables to the hospital. [place] IS NOT             RESPONSIBLE   FOR VALUABLES.   Contacts, glasses, dentures or bridgework may not be worn into surgery.   Bring small overnight bag day of surgery.   DO NOT BRING YOUR HOME MEDICATIONS TO THE HOSPITAL. PHARMACY WILL DISPENSE MEDICATIONS LISTED ON YOUR MEDICATION LIST TO YOU DURING YOUR ADMISSION IN THE HOSPITAL!    Patients discharged on the day of surgery will not be allowed to drive home.  Someone NEEDS to stay with you for the first 24 hours after anesthesia.   Special Instructions: Bring a copy of your healthcare power of attorney and living will documents the day of surgery if you haven't scanned them before.              Please  read over the following fact sheets you were given: IF YOU HAVE QUESTIONS ABOUT YOUR PRE-OP INSTRUCTIONS PLEASE CALL [phone] [place]   If you received a COVID test during your pre-op visit  it is requested that you wear a mask when out in public, stay away from anyone that may not be feeling well and notify your surgeon if you develop symptoms. If you test positive for Covid or have been in contact with anyone that has tested positive in the last 10 days please notify you surgeon.    [place] - Preparing for Surgery Before surgery, you can play an important role.  Because skin is not sterile, your skin needs to be as free of germs as possible.  You can reduce the number of germs on your skin by washing with CHG (chlorahexidine gluconate) soap before surgery.  CHG is an antiseptic cleaner which kills germs and bonds with the skin to continue killing germs even after washing. Please DO NOT use if you have an allergy to CHG or antibacterial soaps.  If your skin  becomes reddened/irritated stop using the CHG and inform your nurse when you arrive at Short Stay. Do not shave (including legs and underarms) for at least 48 hours prior to the first CHG shower.  You may shave your face/neck.  Please follow these instructions carefully:  1.  Shower with CHG Soap the night before surgery and the morning of surgery.  2.  If you choose to wash your hair, wash your hair first as usual with your normal  shampoo.  3.  After you shampoo, rinse your hair and body thoroughly to remove the shampoo.                             4.  Use CHG as you would any other liquid soap.  You can apply chg directly to the skin and wash.  Gently with a scrungie or clean washcloth.  5.  Apply the CHG Soap to your body ONLY FROM THE NECK DOWN.   Do   not use on face/ open                           Wound or open sores. Avoid contact with eyes, ears mouth and   genitals (private parts).                       Wash face,  Genitals (private parts) with your normal soap.             6.  Wash thoroughly, paying special attention to the area where your    surgery  will be performed.  7.  Thoroughly rinse your body with warm water from the neck down.  8.  DO NOT shower/wash with your normal soap after using and rinsing off the CHG Soap.                9.  Pat yourself dry with a clean towel.            10.  Wear clean pajamas.            11.  Place clean sheets on your bed the night of your first shower and do not  sleep with pets. Day of Surgery : Do not apply any CHG, lotions/deodorants the morning of surgery.  Please wear clean clothes to the hospital/surgery center.  FAILURE TO FOLLOW THESE INSTRUCTIONS MAY RESULT IN THE CANCELLATION OF YOUR SURGERY  PATIENT SIGNATURE_________________________________  NURSE SIGNATURE__________________________________  ________________________________________________________________________How to Manage Your Diabetes Before and After Surgery  Why is it  important to control my blood sugar before and after surgery? Improving blood sugar levels before and after surgery helps healing and can limit problems. A way of improving blood sugar control is eating a healthy diet by:  Eating less sugar and carbohydrates  Increasing activity/exercise  Talking with your doctor about reaching your blood sugar goals High blood sugars (greater than 180 mg/dL) can raise your risk of infections and slow your recovery, so you will need to focus on controlling your diabetes during the weeks before surgery. Make sure that the doctor who takes care of your diabetes knows about your planned surgery including the date and location.  How do I manage my blood sugar before surgery? Check your blood sugar at least 4 times a day, starting 2 days before surgery, to make sure that the level is not too high or low. Check your blood sugar the morning of your surgery when you wake up and every 2 hours until you get to the Short Stay unit. If your blood sugar is less than 70 mg/dL, you will need to treat for low blood sugar: Do not take insulin. Treat a low blood sugar (less than 70 mg/dL) with  cup of clear juice (cranberry or apple), 4 glucose tablets, OR glucose gel. Recheck blood sugar in 15 minutes after treatment (to make sure it is greater than 70 mg/dL). If your blood sugar is not greater than 70 mg/dL on recheck, call [phone] for further instructions. Report your blood sugar to the short stay nurse when you get to Short Stay.  If you are admitted to the hospital after surgery: Your blood sugar will be checked by the staff and you will probably be given insulin after surgery (instead of oral diabetes medicines) to make sure you have good blood sugar levels. The goal for blood sugar control after surgery is 80-180 mg/dL.   WHAT DO I DO ABOUT MY DIABETES MEDICATION?  Do not take oral diabetes medicines (pills) the morning of surgery.   THE MORNING OF SURGERY,  take only 1/2 of your normal dose of insulin.  DO NOT TAKE THE FOLLOWING 7 DAYS PRIOR TO SURGERY: Ozempic, Wegovy, Rybelsus (Semaglutide), Byetta (exenatide), Bydureon (exenatide ER), Victoza, Saxenda (liraglutide), or Trulicity  (dulaglutide ) Mounjaro (Tirzepatide) Adlyxin (Lixisenatide), Polyethylene Glycol Loxenatide.   Patient Signature:  Date:   Nurse Signature:  Date:   Reviewed and Endorsed by [facility] Patient Education Committee, August 2015

## 2024-10-10 NOTE — Assessment & Plan Note (Signed)
 Daily AM blood sugar checks Continue trulicity  3mg  weekly Continue 10 units of long-acting insulin daily Can titrate up by 2 units every 3 to 4 days if blood sugars remain above 160.  Stop when sugars are in the 120-160 range, and decrease dose if any home blood sugars are below 120. Patient verbalized understanding and agrees w/ plan.  RTC in 2 weeks w/ PCP Janna

## 2024-10-10 NOTE — Progress Notes (Signed)
 COVID Vaccine received:  []  No [x]  Yes Date of any COVID positive Test in last 90 days: no PCP - [name] DO Cardiologist - n/a  Chest x-ray -  EKG -  10/04/24 Epic Stress Test -  ECHO -  Cardiac Cath -   Bowel Prep - [x]  No  []   Yes ______  Pacemaker / ICD device [x]  No []  Yes   Spinal Cord Stimulator:[x]  No []  Yes       History of Sleep Apnea? [x]  No []  Yes   CPAP used?- [x]  No []  Yes    Does the patient monitor blood sugar?          []  No [x]  Yes  []  N/A  Patient has: []  NO Hx DM   []  Pre-DM                 []  DM1  [x]   DM2 Does patient have a [employer] or Dexacom? [x]  No []  Yes   Fasting Blood Sugar Ranges- 200's Checks Blood Sugar __2___ times a day  GLP1 agonist / usual dose - trulicity - last dose 09/27/24 GLP1 instructions:  SGLT-2 inhibitors / usual dose -  SGLT-2 instructions:   Blood Thinner / Instructions:no Aspirin Instructions:no  Comments:   Activity level: Patient is able to climb a flight of stairs without difficulty; [x]  No CP  [x]  No SOB,    Patient can perform ADLs without assistance.   Anesthesia review:   Patient denies shortness of breath, fever, cough and chest pain at PAT appointment.  Patient verbalized understanding and agreement to the Pre-Surgical Instructions that were given to them at this PAT appointment. Patient was also educated of the need to review these PAT instructions again prior to his/her surgery.I reviewed the appropriate phone numbers to call if they have any and questions or concerns.

## 2024-10-10 NOTE — Patient Instructions (Signed)
 It was great to see you today! I am glad that you are feeling better!   I want you to continue to check your sugar, once a day in the morning before breakfast, and write down those values. You'll follow up in two weeks.  If your blood sugar remains higher than 150, I want you to increase your insulin by 2 units every 3 days. Do not exceed 20 units. Stop increasing it when you sugars are in the 120-160 range. If it goes below 120 then go back to the previous dose.   Thank you for choosing [facility].   Please call [phone] with any questions about today's appointment.  Leafy Scriver, DO Family Medicine

## 2024-10-11 ENCOUNTER — Other Ambulatory Visit: Payer: Self-pay

## 2024-10-11 ENCOUNTER — Encounter (HOSPITAL_COMMUNITY)
Admission: RE | Admit: 2024-10-11 | Discharge: 2024-10-11 | Disposition: A | Discharge status: Home / Self Care | Source: Ambulatory Visit | Attending: Urology | Admitting: Urology

## 2024-10-11 ENCOUNTER — Encounter (HOSPITAL_COMMUNITY): Payer: Self-pay

## 2024-10-11 VITALS — Ht 62.0 in | Wt 243.0 lb

## 2024-10-11 DIAGNOSIS — E119 Type 2 diabetes mellitus without complications: Secondary | ICD-10-CM

## 2024-10-11 DIAGNOSIS — Z01818 Encounter for other preprocedural examination: Secondary | ICD-10-CM

## 2024-10-11 NOTE — Progress Notes (Signed)
 PST appointment done over the phone. Pt. Identified herself by name and DOB. Pt. Answered all questions. Pre op instructions given. Pt. Verbalized understanding. Phone number to admitting given to pt. To do pre admit.

## 2024-10-11 NOTE — Progress Notes (Signed)
 Request sent to Dr. FREDRIK Ferrara to  review pt's pre op CBC from 10/06/24.

## 2024-10-11 NOTE — Progress Notes (Signed)
 Pre op instructions emailed to [email or web address].

## 2024-10-14 NOTE — H&P (Signed)
  Visit Note - October 12, 2024 Jasmin Collins, Jasmin Collins MRN: [id number] Phone: [phone] DOB: 1966/02/04 Sex: Female PMS ID: [id number] WADDELL SHARPS (Primary Provider) (Bill Under) Page 1 [phone] Work [phone] Fax [facility] [address] Christianna janifer MAAS [place], [place] [address] Social History Obtained and Reviewed October 12, 2024. Single Question Alcohol Screening: 0 days EtOH none Smoking status - Never smoker Tobacco Use Does not use vaping products Does not use smokeless tobacco Tobacco Product: None Medications Obtained and Reviewed October 12, 2024. Accu-Chek FastClix Lancing Dev Miscellaneous - kit atorvastatin  40 mg Oral - tablet cefadroxil 500 mg Oral - capsule citalopram  40 mg Oral - tablet hydroxyzine  HCl 50 mg Oral - tablet oxycodone  10 mg Oral - tablet pantoprazole  40 mg Oral - tablet, delayed release (enteric coated) Allergies Obtained and Reviewed October 12, 2024. No known drug allergies Medical History Obtained and Reviewed October 12, 2024. Other Surgical History Obtained and Reviewed October 12, 2024. None Chief Complaints: 1. F/U Kidney Stone and Ureteral Stone evaluated on October 06, 2024 HPI: This is a 58 year old female who: is following up for kidney stone and ureteral stone (Calculus of kidney with calculus of ureter). On October 06, 2024, she was treated with Schedule Surgery. The patient presents for surgical planning, urine specimen, and office visit. 1. Historical Summary: Torra is a 58 year old female presenting today as a preop for ureteroscopy on 10/17/2024 for treatment of a 14 mm left ureteral stone. She initially presented to the ED with obstructive uropathy and sepsis secondary to her stone. Stent was placed on 10/05/2024 with Dr. Renda. Culture grew multiple species, blood cultures negative. Vitals: Date Taken By B.P. Pulse Resp. O2 Sat. Temp. Ht. Wt. BMI  BSA 10/12/24 10:28 THOMAS, JENNIFER 119/85 SIT 80 97.3 F 63.0 in* 243.0 lbs* 43 2.1 FiO2 * Patient Reported Exam: Exam Appearance: well developed and nourished, in no acute distress Neck Exam: neck is supple Respiratory Effort: normal respiratory effort without labored breathing  Skin Inspection: normal skin turgor Orientation: alert and oriented to person, place, time Mood: mood and affect well-adjusted, pleasant and cooperative, appropriate for clinical and encounter circumstances Data Reviewed:  Review of prior external note(s) from each unique source, 1 Ordering of each unique test (Urine Culture, Urology Workup), and Independent interpretation of a test performed by another physician/other qualified health care professional (not separately reported) Impression/Plan: There are no changes in the patients history or physical exam since last evaluation. Pt is scheduled to undergo left ureteroscopy on 10/17/2024. All questions were answered to the best of my ability. 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 I counseled the patient regarding the following:   We discussed the diagnosis and management plan. 1. Visit Note - October 12, 2024 Jasmin Collins, Jasmin Collins MRN: [id number] Phone: [phone] DOB: [birth year] Sex: Female PMS ID: [id number] WADDELL SHARPS (Primary Provider) (Bill Under) Page 2 [phone] Work [phone] Fax [facility] [address] [place], [place] [address] After counseling the patient, we decided on the following plan for the LEFT: Ureteroscopy Plan: Visit complexity. Medical care services associated with ongoing care, beyond the standard evaluation and management Indication: Provided medical care services as part of ongoing care related to the patient's single, serious or complex chronic condition. Plan: Order Tests. Labs:  [id number] - Urine Culture, Urology Workup Follow up - (as  previously scheduled) Staff: WADDELL SHARPS (Primary Provider) Cherylin Under) [facility] Electronically Signed By: WADDELL SHARPS, 10/12/2024 10:48 AM EST

## 2024-10-17 ENCOUNTER — Encounter (HOSPITAL_COMMUNITY): Payer: Self-pay | Admitting: Urology

## 2024-10-17 ENCOUNTER — Encounter (HOSPITAL_COMMUNITY)
Admission: RE | Disposition: A | Discharge status: Home / Self Care | Payer: Self-pay | Source: Home / Self Care | Attending: Urology

## 2024-10-17 ENCOUNTER — Ambulatory Visit (HOSPITAL_COMMUNITY)
Admission: RE | Admit: 2024-10-17 | Discharge: 2024-10-17 | Disposition: A | Discharge status: Home / Self Care | Attending: Urology | Admitting: Urology

## 2024-10-17 ENCOUNTER — Ambulatory Visit: Payer: Self-pay | Admitting: Family Medicine

## 2024-10-17 ENCOUNTER — Ambulatory Visit (HOSPITAL_COMMUNITY): Admitting: Anesthesiology

## 2024-10-17 ENCOUNTER — Ambulatory Visit (HOSPITAL_COMMUNITY)

## 2024-10-17 DIAGNOSIS — E66813 Obesity, class 3: Secondary | ICD-10-CM | POA: Insufficient documentation

## 2024-10-17 DIAGNOSIS — N200 Calculus of kidney: Secondary | ICD-10-CM

## 2024-10-17 DIAGNOSIS — Z01818 Encounter for other preprocedural examination: Secondary | ICD-10-CM

## 2024-10-17 DIAGNOSIS — D649 Anemia, unspecified: Secondary | ICD-10-CM | POA: Diagnosis not present

## 2024-10-17 DIAGNOSIS — E119 Type 2 diabetes mellitus without complications: Secondary | ICD-10-CM | POA: Insufficient documentation

## 2024-10-17 DIAGNOSIS — Z6841 Body Mass Index (BMI) 40.0 and over, adult: Secondary | ICD-10-CM | POA: Insufficient documentation

## 2024-10-17 DIAGNOSIS — F419 Anxiety disorder, unspecified: Secondary | ICD-10-CM | POA: Diagnosis not present

## 2024-10-17 DIAGNOSIS — N202 Calculus of kidney with calculus of ureter: Secondary | ICD-10-CM | POA: Diagnosis present

## 2024-10-17 HISTORY — PX: CYSTOSCOPY/URETEROSCOPY/HOLMIUM LASER/STENT PLACEMENT: SHX6546

## 2024-10-17 LAB — BASIC METABOLIC PANEL WITH GFR
Anion gap: 10 (ref 5–15)
BUN: 12 mg/dL (ref 6–20)
CO2: 23 mmol/L (ref 22–32)
Calcium: 8.9 mg/dL (ref 8.9–10.3)
Chloride: 104 mmol/L (ref 98–111)
Creatinine, Ser: 0.83 mg/dL (ref 0.44–1.00)
GFR, Estimated: >60 mL/min (ref >60)
Glucose, Bld: 185 mg/dL — ABNORMAL HIGH (ref 70–99)
Potassium: 4.2 mmol/L (ref 3.5–5.1)
Sodium: 137 mmol/L (ref 135–145)

## 2024-10-17 LAB — GLUCOSE, CAPILLARY
Glucose-Capillary: 226 mg/dL — ABNORMAL HIGH (ref 70–99)
Glucose-Capillary: 190 mg/dL — ABNORMAL HIGH (ref 70–99)
Glucose-Capillary: 177 mg/dL — ABNORMAL HIGH (ref 70–99)

## 2024-10-17 LAB — CBC
HCT: 27.2 % — ABNORMAL LOW (ref 36.0–46.0)
Hemoglobin: 8.7 g/dL — ABNORMAL LOW (ref 12.0–15.0)
MCH: 26.3 pg (ref 26.0–34.0)
MCHC: 32 g/dL (ref 30.0–36.0)
MCV: 82.2 fL (ref 80.0–100.0)
Platelets: 489 K/uL — ABNORMAL HIGH (ref 150–400)
RBC: 3.31 MIL/uL — ABNORMAL LOW (ref 3.87–5.11)
RDW: 16.8 % — ABNORMAL HIGH (ref 11.5–15.5)
WBC: 5.3 K/uL (ref 4.0–10.5)
nRBC: 0 % (ref 0.0–0.2)

## 2024-10-17 SURGERY — CYSTOSCOPY/URETEROSCOPY/HOLMIUM LASER/STENT PLACEMENT
Anesthesia: General | Laterality: Left

## 2024-10-17 MED ORDER — PROPOFOL 10 MG/ML IV BOLUS
INTRAVENOUS | Status: DC | PRN
  Administered 2024-10-17: 30 mg via INTRAVENOUS
  Administered 2024-10-17: 40 mg via INTRAVENOUS
  Administered 2024-10-17: 170 mg via INTRAVENOUS

## 2024-10-17 MED ORDER — ORAL CARE MOUTH RINSE: 15.0000 mL | Freq: Once | OROMUCOSAL | Status: AC

## 2024-10-17 MED ORDER — SODIUM CHLORIDE 0.9 % IV SOLN
2.0000 g | INTRAVENOUS | Status: AC
  Administered 2024-10-17: 2 g via INTRAVENOUS
  Filled 2024-10-17: qty 20

## 2024-10-17 MED ORDER — TRAMADOL HCL 50 MG PO TABS: 50.0000 mg | ORAL_TABLET | Freq: Four times a day (QID) | ORAL | 0 refills | Status: AC | PRN

## 2024-10-17 MED ORDER — EPHEDRINE SULFATE (PRESSORS) 25 MG/5ML IV SOSY
PREFILLED_SYRINGE | INTRAVENOUS | Status: DC | PRN
  Administered 2024-10-17: 5 mg via INTRAVENOUS

## 2024-10-17 MED ORDER — LIDOCAINE HCL (PF) 2 % IJ SOLN
INTRAMUSCULAR | Status: DC | PRN
  Administered 2024-10-17: 60 mg via INTRADERMAL

## 2024-10-17 MED ORDER — PHENYLEPHRINE 80 MCG/ML (10ML) SYRINGE FOR IV PUSH (FOR BLOOD PRESSURE SUPPORT)
PREFILLED_SYRINGE | INTRAVENOUS | Status: DC | PRN
  Administered 2024-10-17 (×3): 160 ug via INTRAVENOUS
  Administered 2024-10-17: 80 ug via INTRAVENOUS
  Administered 2024-10-17: 160 ug via INTRAVENOUS

## 2024-10-17 MED ORDER — ONDANSETRON HCL 4 MG/2ML IJ SOLN
INTRAMUSCULAR | Status: DC | PRN
Start: 2024-10-17 — End: 2024-10-17
  Administered 2024-10-17: 4 mg via INTRAVENOUS

## 2024-10-17 MED ORDER — ONDANSETRON HCL 4 MG/2ML IJ SOLN
INTRAMUSCULAR | Status: AC
  Filled 2024-10-17: qty 2

## 2024-10-17 MED ORDER — LACTATED RINGERS IV SOLN: INTRAVENOUS | Status: DC

## 2024-10-17 MED ORDER — SODIUM CHLORIDE 0.9 % IR SOLN
Status: DC | PRN
  Administered 2024-10-17: 3000 mL

## 2024-10-17 MED ORDER — FENTANYL CITRATE (PF) 100 MCG/2ML IJ SOLN
INTRAMUSCULAR | Status: AC
  Filled 2024-10-17: qty 2

## 2024-10-17 MED ORDER — FENTANYL CITRATE (PF) 100 MCG/2ML IJ SOLN
INTRAMUSCULAR | Status: DC | PRN
  Administered 2024-10-17 (×2): 50 ug via INTRAVENOUS

## 2024-10-17 MED ORDER — ACETAMINOPHEN 500 MG PO TABS
1000.0000 mg | ORAL_TABLET | Freq: Once | ORAL | Status: AC
  Administered 2024-10-17: 1000 mg via ORAL
  Filled 2024-10-17: qty 2

## 2024-10-17 MED ORDER — CHLORHEXIDINE GLUCONATE 0.12 % MT SOLN
15.0000 mL | Freq: Once | OROMUCOSAL | Status: AC
  Administered 2024-10-17: 15 mL via OROMUCOSAL

## 2024-10-17 MED ORDER — MIDAZOLAM HCL 2 MG/2ML IJ SOLN
INTRAMUSCULAR | Status: AC
  Filled 2024-10-17: qty 2

## 2024-10-17 MED ORDER — EPHEDRINE 5 MG/ML INJ
INTRAVENOUS | Status: AC
  Filled 2024-10-17: qty 5

## 2024-10-17 MED ORDER — PROPOFOL 10 MG/ML IV BOLUS
INTRAVENOUS | Status: AC
  Filled 2024-10-17: qty 20

## 2024-10-17 MED ORDER — CIPROFLOXACIN IN D5W 400 MG/200ML IV SOLN: 400.0000 mg | INTRAVENOUS | Status: DC

## 2024-10-17 MED ORDER — INSULIN ASPART 100 UNIT/ML IJ SOLN: 0.0000 [IU] | INTRAMUSCULAR | Status: DC | PRN

## 2024-10-17 MED ORDER — DEXMEDETOMIDINE HCL IN NACL 80 MCG/20ML IV SOLN
INTRAVENOUS | Status: DC | PRN
  Administered 2024-10-17: 8 ug via INTRAVENOUS

## 2024-10-17 MED ORDER — DEXAMETHASONE SOD PHOSPHATE PF 10 MG/ML IJ SOLN
INTRAMUSCULAR | Status: DC | PRN
  Administered 2024-10-17: 4 mg via INTRAVENOUS

## 2024-10-17 MED ORDER — PHENYLEPHRINE 80 MCG/ML (10ML) SYRINGE FOR IV PUSH (FOR BLOOD PRESSURE SUPPORT)
PREFILLED_SYRINGE | INTRAVENOUS | Status: AC
  Filled 2024-10-17: qty 10

## 2024-10-17 MED ORDER — MIDAZOLAM HCL 5 MG/5ML IJ SOLN
INTRAMUSCULAR | Status: DC | PRN
Start: 2024-10-17 — End: 2024-10-17
  Administered 2024-10-17: 2 mg via INTRAVENOUS

## 2024-10-17 SURGICAL SUPPLY — 18 items
BAG URO CATCHER STRL LF (MISCELLANEOUS) ×1 IMPLANT
BASKET ZERO TIP NITINOL 2.4FR (BASKET) IMPLANT
CATH URETL OPEN END 6FR 70 (CATHETERS) IMPLANT
CLOTH BEACON ORANGE TIMEOUT ST (SAFETY) ×1 IMPLANT
GLOVE SURG LX STRL 7.5 STRW (GLOVE) ×1 IMPLANT
GOWN STRL REUS W/ TWL XL LVL3 (GOWN DISPOSABLE) ×1 IMPLANT
GUIDEWIRE STR DUAL SENSOR (WIRE) ×1 IMPLANT
GUIDEWIRE ZIPWRE .038 STRAIGHT (WIRE) IMPLANT
KIT TURNOVER KIT A (KITS) ×1 IMPLANT
MANIFOLD NEPTUNE II (INSTRUMENTS) ×1 IMPLANT
PACK CYSTO (CUSTOM PROCEDURE TRAY) ×1 IMPLANT
SHEATH NAVIGATOR HD 11/13X28 (SHEATH) IMPLANT
SHEATH NAVIGATOR HD 11/13X36 (SHEATH) IMPLANT
SHEATH NAVIGATOR HD 12/14X36 (SHEATH) IMPLANT
STENT URET 6FRX24 CONTOUR (STENTS) IMPLANT
TRACTIP FLEXIVA PULS ID 200XHI (Laser) IMPLANT
TUBING CONNECTING 10 (TUBING) ×1 IMPLANT
TUBING UROLOGY SET (TUBING) ×1 IMPLANT

## 2024-10-17 NOTE — Anesthesia Postprocedure Evaluation (Signed)
 Anesthesia Post Note  Patient: Jasmin Collins  Procedure(s) Performed: CYSTOSCOPY/URETEROSCOPY/HOLMIUM LASER/STENT PLACEMENT (Left)     Patient location during evaluation: PACU Anesthesia Type: General Level of consciousness: awake and alert Pain management: pain level controlled Vital Signs Assessment: post-procedure vital signs reviewed and stable Respiratory status: spontaneous breathing, nonlabored ventilation, respiratory function stable and patient connected to nasal cannula oxygen Cardiovascular status: blood pressure returned to baseline and stable Postop Assessment: no apparent nausea or vomiting Anesthetic complications: no   No notable events documented.  Last Vitals:  Vitals:   10/17/24 1230 10/17/24 1240  BP: (!) 162/104 124/89  Pulse: 91 88  Resp: 20 20  Temp:    SpO2: 96% 97%    Last Pain:  Vitals:   10/17/24 1240  TempSrc:   PainSc: 0-No pain                 Alexy Heldt S

## 2024-10-17 NOTE — Transfer of Care (Signed)
 Immediate Anesthesia Transfer of Care Note  Patient: Jasmin Collins  Procedure(s) Performed: CYSTOSCOPY/URETEROSCOPY/HOLMIUM LASER/STENT PLACEMENT (Left)  Patient Location: PACU  Anesthesia Type:General  Level of Consciousness: drowsy and patient cooperative  Airway & Oxygen Therapy: Patient Spontanous Breathing and Patient connected to face mask oxygen  Post-op Assessment: Report given to RN and Post -op Vital signs reviewed and stable  Post vital signs: Reviewed and stable  Last Vitals:  Vitals Value Taken Time  BP 137/88 10/17/24 12:17  Temp    Pulse 89 10/17/24 12:19  Resp 18 10/17/24 12:19  SpO2 100 % 10/17/24 12:19  Vitals shown include unfiled device data.  Last Pain:  Vitals:   10/17/24 0915  TempSrc:   PainSc: 0-No pain         Complications: No notable events documented.

## 2024-10-17 NOTE — OR Nursing (Signed)
Stone taken by Dr. Borden 

## 2024-10-17 NOTE — Anesthesia Preprocedure Evaluation (Signed)
 Anesthesia Evaluation  Patient identified by MRN, date of birth, ID band Patient awake    Reviewed: Allergy & Precautions, H&P , NPO status , Patient's Chart, lab work & pertinent test results  Airway Mallampati: II   Neck ROM: full    Dental   Pulmonary neg pulmonary ROS   breath sounds clear to auscultation       Cardiovascular negative cardio ROS  Rhythm:regular Rate:Normal     Neuro/Psych   Anxiety        GI/Hepatic ,GERD  ,,  Endo/Other  diabetes, Type 2  Class 3 obesity  Renal/GU Renal disease     Musculoskeletal   Abdominal   Peds  Hematology  (+) Blood dyscrasia, anemia   Anesthesia Other Findings   Reproductive/Obstetrics                              Anesthesia Physical Anesthesia Plan  ASA: 2  Anesthesia Plan: General   Post-op Pain Management:    Induction: Intravenous  PONV Risk Score and Plan: 3 and Ondansetron , Dexamethasone , Midazolam and Treatment may vary due to age or medical condition  Airway Management Planned: LMA  Additional Equipment:   Intra-op Plan:   Post-operative Plan: Extubation in OR  Informed Consent: I have reviewed the patients History and Physical, chart, labs and discussed the procedure including the risks, benefits and alternatives for the proposed anesthesia with the patient or authorized representative who has indicated his/her understanding and acceptance.     Dental advisory given  Plan Discussed with: CRNA, Anesthesiologist and Surgeon  Anesthesia Plan Comments:         Anesthesia Quick Evaluation

## 2024-10-17 NOTE — Interval H&P Note (Signed)
 History and Physical Interval Note:  10/17/2024 11:01 AM  Jasmin Collins  has presented today for surgery, with the diagnosis of KIDNEY AND URETERAL STONES.  The various methods of treatment have been discussed with the patient and family. After consideration of risks, benefits and other options for treatment, the patient has consented to  Procedure(s): CYSTOSCOPY/URETEROSCOPY/HOLMIUM LASER/STENT PLACEMENT (Left) as a surgical intervention.  The patient's history has been reviewed, patient examined, no change in status, stable for surgery.  I have reviewed the patient's chart and labs.  Questions were answered to the patient's satisfaction.     [employer]

## 2024-10-17 NOTE — Progress Notes (Signed)
 Per Dr. Maryclare no insulin needed at this time.  Will monitor blood sugar.

## 2024-10-17 NOTE — Op Note (Signed)
 Preoperative diagnosis: Left renal calculus  Postoperative diagnosis: Left renal calculus  Procedure:  Cystoscopy Left ureteroscopy and stone removal Ureteroscopic laser lithotripsy Left ureteral stent placement (6 x 24 with string)  Surgeon: Gretel CANDIE Ferrara, Mickey. M.D.  Anesthesia: General  Complications: None  EBL: Minimal  Specimens: Left renal calculus  Disposition of specimens: [facility] for stone analysis  Indication: Jasmin Collins  is a 58 y.o. patient with urolithiasis.  She presented with a 1.4 cm left UPJ calculus and sepsis and underwent ureteral stenting.  She was treated with antibiotic therapy and presents today for definitive treatment of her stone. After reviewing the management options for treatment, they elected to proceed with the above surgical procedure(s). We have discussed the potential benefits and risks of the procedure, side effects of the proposed treatment, the likelihood of the patient achieving the goals of the procedure, and any potential problems that might occur during the procedure or recuperation. Informed consent has been obtained.  Description of procedure:  The patient was taken to the operating room and general anesthesia was induced.  The patient was placed in the dorsal lithotomy position, prepped and draped in the usual sterile fashion, and preoperative antibiotics were administered. A preoperative time-out was performed.   Cystourethroscopy was performed.  The patient's urethra was examined and was normal. The bladder was then systematically examined in its entirety. There was no evidence for any bladder tumors, stones, or other mucosal pathology.    Attention then turned to the left ureteral orifice and the indwelling ureteral stent was identified and brought out to the urethral meatus with flexible graspers.  A 0.38 sensor guidewire was then advanced up the left ureter into the renal pelvis under fluoroscopic  guidance.  A 12/14 Fr ureteral access sheath was then advanced over the guide wire. The digital flexible ureteroscope was then advanced through the access sheath into the ureter next to the guidewire and the calculus was identified and was located in the left renal pelvis.   The stone was then fragmented with the 200 micron holmium laser fiber on a setting of 0.3 J and frequency of 60 Hz.   All sizable stones were then removed with a zero tip nitinol basket.  Reinspection of the ureter/renal pelvis revealed no remaining visible stones or fragments of significant size.   The safety wire was then replaced and the access sheath removed.  The guidewire was backloaded through the cystoscope and a ureteral stent was advance over the wire using Seldinger technique.  The stent was positioned appropriately under fluoroscopic and cystoscopic guidance.  The wire was then removed with an adequate stent curl noted in the renal pelvis as well as in the bladder.  The bladder was then emptied and the procedure ended.  The patient appeared to tolerate the procedure well and without complications.  The patient was able to be awakened and transferred to the recovery unit in satisfactory condition.   Gretel CANDIE Ferrara Teddie MD

## 2024-10-17 NOTE — Anesthesia Procedure Notes (Signed)
 Procedure Name: LMA Insertion Date/Time: 10/17/2024 11:21 AM  Performed by: Franchot Delon RAMAN, CRNAPre-anesthesia Checklist: Patient identified, Emergency Drugs available, Suction available and Patient being monitored Patient Re-evaluated:Patient Re-evaluated prior to induction Oxygen Delivery Method: Circle System Utilized Preoxygenation: Pre-oxygenation with 100% oxygen Induction Type: IV induction Ventilation: Mask ventilation without difficulty LMA: LMA inserted LMA Size: 4.0 Number of attempts: 1 Placement Confirmation: positive ETCO2 Tube secured with: Tape Dental Injury: Teeth and Oropharynx as per pre-operative assessment

## 2024-10-17 NOTE — Discharge Instructions (Addendum)
 You may see some blood in the urine and may have some burning with urination for 48-72 hours. You also may notice that you have to urinate more frequently or urgently after your procedure which is normal.  You should call should you develop an inability urinate, fever > 101, persistent nausea and vomiting that prevents you from eating or drinking to stay hydrated.  If you have a stent, you will likely urinate more frequently and urgently until the stent is removed and you may experience some discomfort/pain in the lower abdomen and flank especially when urinating. You may take pain medication prescribed to you if needed for pain. You may also intermittently have blood in the urine until the stent is removed. 5.   You may remove your stent on Friday morning.  Simply pull the string that is taped to your body and the stent will easily come out.  This may be best done in the shower as some urine may come out with the stent.  Usually you will feel relief once the stent is removed, but occasionally patients can develop pain due to residual swelling of the ureter that may temporarily obstruct the kidney.  This can be managed by taking pain medication and it will typically resolve with time.  Please do not hesitate to call if you have pain that is not controlled with your pain medication or does not improved within 24-48 hours.

## 2024-10-18 ENCOUNTER — Encounter (HOSPITAL_COMMUNITY): Payer: Self-pay | Admitting: Urology

## 2024-10-19 ENCOUNTER — Other Ambulatory Visit: Payer: Self-pay

## 2024-10-19 ENCOUNTER — Other Ambulatory Visit (HOSPITAL_COMMUNITY): Payer: Self-pay

## 2024-10-19 MED ORDER — OXYCODONE-ACETAMINOPHEN 10-325 MG PO TABS
1.0000 | ORAL_TABLET | Freq: Four times a day (QID) | ORAL | 0 refills | Status: DC | PRN
  Filled 2024-10-21: qty 120, 30d supply, fill #0
  Filled ????-??-??: fill #0

## 2024-10-20 ENCOUNTER — Ambulatory Visit: Payer: Self-pay | Admitting: Family Medicine

## 2024-10-20 NOTE — Progress Notes (Deleted)
    SUBJECTIVE:   CHIEF COMPLAINT / HPI:   DM ***  PERTINENT  PMH / PSH: ***  OBJECTIVE:   LMP  (LMP Unknown)   General: Awake and Alert in NAD HEENT: NCAT. Sclera anicteric. No rhinorrhea. Cardiovascular: RRR. No M/R/G Respiratory: CTAB, normal WOB on RA. No wheezing, crackles, rhonchi, or diminished breath sounds. Abdomen: Soft, non-tender, non-distended. Bowel sounds normoactive/hypoactive/hyperactive. *** Extremities: Able to move all extremities. No BLE edema, no deformities or significant joint findings. Skin: Warm and dry. No abrasions or rashes noted. Neuro: A&Ox***. No focal neurological deficits.  ASSESSMENT/PLAN:   Assessment & Plan Type 2 diabetes mellitus without complication, without long-term current use of insulin (HCC) - Last A1c *** in *** - Home CBGs: *** - Medications: *** - Adherence: *** - Eye exam: *** - Foot exam: *** - Microalbumin: *** - Statin: *** - No symptoms of hypoglycemia, polyuria, polydipsia, numbness extremities, foot ulcers/trauma     [name], DO [facility]

## 2024-10-20 NOTE — Assessment & Plan Note (Deleted)
-   Last A1c *** in *** - Home CBGs: *** - Medications: *** - Adherence: *** - Eye exam: *** - Foot exam: *** - Microalbumin: *** - Statin: *** - No symptoms of hypoglycemia, polyuria, polydipsia, numbness extremities, foot ulcers/trauma

## 2024-10-21 ENCOUNTER — Other Ambulatory Visit (HOSPITAL_COMMUNITY): Payer: Self-pay

## 2024-10-28 ENCOUNTER — Encounter: Payer: Self-pay | Admitting: Family Medicine

## 2024-10-31 MED ORDER — ALBUTEROL SULFATE HFA 108 (90 BASE) MCG/ACT IN AERS: 2.0000 | INHALATION_SPRAY | Freq: Four times a day (QID) | RESPIRATORY_TRACT | 0 refills | Status: DC | PRN

## 2024-11-17 MED ORDER — ALBUTEROL SULFATE HFA 108 (90 BASE) MCG/ACT IN AERS: 2.0000 | INHALATION_SPRAY | Freq: Four times a day (QID) | RESPIRATORY_TRACT | 2 refills | Status: DC | PRN

## 2024-11-21 ENCOUNTER — Other Ambulatory Visit (HOSPITAL_COMMUNITY): Payer: Self-pay

## 2024-11-21 ENCOUNTER — Ambulatory Visit: Payer: Self-pay

## 2024-11-21 MED ORDER — OXYCODONE-ACETAMINOPHEN 10-325 MG PO TABS
1.0000 | ORAL_TABLET | Freq: Four times a day (QID) | ORAL | 0 refills | Status: DC | PRN
  Filled 2024-11-21: qty 120, 30d supply, fill #0

## 2024-11-30 ENCOUNTER — Other Ambulatory Visit: Payer: Self-pay | Admitting: Family Medicine

## 2024-12-02 ENCOUNTER — Encounter (HOSPITAL_COMMUNITY): Payer: Self-pay

## 2024-12-02 ENCOUNTER — Ambulatory Visit (HOSPITAL_COMMUNITY)
Admission: EM | Admit: 2024-12-02 | Discharge: 2024-12-02 | Disposition: A | Discharge status: Home / Self Care | Attending: Physician Assistant | Admitting: Physician Assistant

## 2024-12-02 ENCOUNTER — Telehealth: Admitting: Physician Assistant

## 2024-12-02 DIAGNOSIS — H9201 Otalgia, right ear: Secondary | ICD-10-CM

## 2024-12-02 DIAGNOSIS — H60391 Other infective otitis externa, right ear: Secondary | ICD-10-CM | POA: Diagnosis not present

## 2024-12-02 MED ORDER — OFLOXACIN 0.3 % OT SOLN: 5.0000 [drp] | Freq: Two times a day (BID) | OTIC | 0 refills | Status: DC

## 2024-12-02 NOTE — ED Triage Notes (Signed)
 Patient reports that she has had right ear pain since last night.  Patient has not had any medications for her symptoms.

## 2024-12-02 NOTE — Discharge Instructions (Signed)
 We are treating you for an ear infection of the auditory canal.  Apply ofloxacin drops twice daily for 10 days.  Keep your ear facing up for a few minutes after applying the drops to allow this to go all the way to the ear canal.  Alternate Tylenol  and ibuprofen  for pain.  If you are not feeling better within 1 to 2 days please return for reevaluation.  If anything worsens you have increasing pain, drainage from the ear, fever, difficulty hearing you should be reevaluated.

## 2024-12-02 NOTE — Progress Notes (Signed)
" °  Because of having severe 10/10 pain of the ear, I feel your condition warrants further evaluation and I recommend that you be seen in a face-to-face visit for a thorough ear exam to rule out rupture of the ear drum.   NOTE: There will be NO CHARGE for this E-Visit   If you are having a true medical emergency, please call 911.     For an urgent face to face visit, [facility] has multiple urgent care centers for your convenience.  Click the link below for the full list of locations and hours, walk-in wait times, appointment scheduling options and driving directions:  Urgent Care - [place], [place], [place], [place], [place], [place]       Your MyChart E-visit questionnaire answers were reviewed by a board certified advanced clinical practitioner to complete your personal care plan based on your specific symptoms.    Thank you for using e-Visits.    "

## 2024-12-02 NOTE — ED Provider Notes (Signed)
 " [facility]    CSN: [id number] Arrival date & time: 12/02/24  0820      History   Chief Complaint Chief Complaint  Patient presents with   Otalgia    HPI Jasmin Collins is a 59 y.o. female.   Patient presents today with a 12-hour history of right otalgia.  She reports that pain is rated 10 on a 0-10 pain scale, described as throbbing, worse with manipulation of the ear, no alleviating factors identified.  She denies any recent illness including URI such as cough, congestion, fever, nausea, vomiting.  She denies any otorrhea.  Denies any recent swimming or airplane travel.  She has not tried any over-the-counter medication for symptom management.  She did complete an e-visit but they recommended that since she rated the pain 10 on a 0-10 pain scale she be evaluated in person.  She denies history of recurrent ear infections and has not seen an ENT.  She does have a history of diabetes and her last A1c was slightly above goal on 10/05/2024 at 8.1%.  She was last treated with antibiotics 10/06/2024 with cefadroxil .  She does not use earbuds, earplugs, Q-tips.    Past Medical History:  Diagnosis Date   Anxiety    Diabetes mellitus without complication (HCC)    GERD (gastroesophageal reflux disease)    Kidney stones    Renal disorder     Patient Active Problem List   Diagnosis Date Noted   Dilutional anemia 10/06/2024   Chronic pain 10/05/2024   Kidney stone 10/05/2024   Encounter for fitting of ureteral stent - placed 10-05-2024 10/05/2024   Obesity, Class III, BMI 40-49.9 (morbid obesity) (HCC) 10/05/2024   Obstructive pyelonephritis 10/04/2024   Alopecia 03/24/2022   Hyperlipidemia associated with type 2 diabetes mellitus (HCC) 11/12/2021   Anemia 05/30/2021   Type 2 diabetes mellitus without complication, without long-term current use of insulin  (HCC) 02/14/2021   Anxiety 02/14/2021   Acute bilateral low back pain without sciatica 10/05/2020   Gastroesophageal  reflux disease without esophagitis 10/05/2020    Past Surgical History:  Procedure Laterality Date   CYSTOSCOPY W/ URETERAL STENT PLACEMENT Left 10/05/2024   Procedure: CYSTOSCOPY, WITH URETERAL STENT INSERTION;  Surgeon: Renda Glance, MD;  Location: [facility];  Service: Urology;  Laterality: Left;   CYSTOSCOPY/URETEROSCOPY/HOLMIUM LASER/STENT PLACEMENT Left 10/17/2024   Procedure: CYSTOSCOPY/URETEROSCOPY/HOLMIUM LASER/STENT PLACEMENT;  Surgeon: Renda Glance, MD;  Location: [facility];  Service: Urology;  Laterality: Left;    OB History   No obstetric history on file.      Home Medications    Prior to Admission medications  Medication Sig Start Date End Date Taking? Authorizing Provider  ofloxacin (FLOXIN) 0.3 % OTIC solution Place 5 drops into the right ear 2 (two) times daily for 10 days. 12/02/24 12/12/24 Yes Dejon Lukas K, PA-C  albuterol  (VENTOLIN  HFA) 108 (90 Base) MCG/ACT inhaler TAKE 2 PUFFS BY MOUTH EVERY 6 HOURS AS NEEDED FOR WHEEZE OR SHORTNESS OF BREATH 11/30/24   Baloch, Mahnoor, MD  atorvastatin  (LIPITOR) 40 MG tablet Take 1 tablet (40 mg total) by mouth daily. Patient not taking: Reported on 10/05/2024 12/21/23   Janna Ferrier, DO  Blood Glucose Monitoring Suppl DEVI 1 each by Does not apply route as directed. Dispense based on patient and insurance preference. Use up to four times daily as directed. (FOR ICD-10 E10.9, E11.9). 10/06/24   Laurence Locus, DO  citalopram  (CELEXA ) 40 MG tablet TAKE 1 TABLET BY MOUTH EVERY DAY 07/08/24  Janna Ferrier, DO  Dulaglutide  (TRULICITY ) 3 MG/0.5ML SOAJ INJECT 3 MG AS DIRECTED ONCE A WEEK. Patient taking differently: Inject 3 mg as directed every Sunday. 10/04/24   Janna Ferrier, DO  Glucose Blood (BLOOD GLUCOSE TEST STRIPS) STRP 1 each by Does not apply route as directed. Dispense based on patient and insurance preference. Use up to four times daily as directed. (FOR ICD-10 E10.9, E11.9). 10/06/24   Laurence Locus, DO  hydrOXYzine  (ATARAX ) 50 MG  tablet TAKE 1-2 TABLETS EVERY 6HRS AS NEEDED FOR ANXIETY Patient taking differently: Take 50-100 mg by mouth every 6 (six) hours as needed for anxiety. 09/23/24   Gomes, Adriana, DO  Insulin  Glargine (BASAGLAR  KWIKPEN) 100 UNIT/ML Inject 10 Units into the skin every morning. 10/07/24   Manon Jester, DO  insulin  glargine (LANTUS ) 100 UNIT/ML Solostar Pen Inject 10 Units into the skin daily. 10/07/24   Manon Jester, DO  Insulin  Pen Needle (B-D UF III MINI PEN NEEDLES) 31G X 5 MM MISC Use to inject once daily 10/07/24   Manon Jester, DO  Lancet Device MISC 1 each by Does not apply route as directed. Dispense based on patient and insurance preference. Use up to four times daily as directed. (FOR ICD-10 E10.9, E11.9). 10/06/24   Laurence Locus, DO  Lancets MISC 1 each by Does not apply route as directed. Dispense based on patient and insurance preference. Use up to four times daily as directed. (FOR ICD-10 E10.9, E11.9). 10/06/24   Laurence Locus, DO  ondansetron  (ZOFRAN ) 4 MG tablet Take 1 tablet (4 mg total) by mouth every 6 (six) hours as needed for nausea. Patient not taking: Reported on 12/02/2024 10/06/24   Laurence Locus, DO  oxyCODONE -acetaminophen  (PERCOCET) 10-325 MG tablet Take 1 tablet by mouth 4 (four) times daily as needed. 11/21/24     pantoprazole  (PROTONIX ) 40 MG tablet TAKE 1 TABLET(40 MG) BY MOUTH DAILY Patient taking differently: Take 40 mg by mouth daily as needed (for acid reflux). 04/13/24   Gomes, Adriana, DO  polyethylene glycol (MIRALAX  / GLYCOLAX ) 17 g packet Take 17 g by mouth daily as needed for mild constipation. Patient not taking: Reported on 12/02/2024 10/06/24   Laurence Locus, DO  traMADol  (ULTRAM ) 50 MG tablet Take 1-2 tablets (50-100 mg total) by mouth every 6 (six) hours as needed (pain). Patient not taking: Reported on 12/02/2024 10/17/24   Renda Glance, MD    Family History Family History  Problem Relation Age of Onset   Hypertension Mother     Social History Social  History[1]   Allergies   Patient has no known allergies.   Review of Systems Review of Systems  Constitutional:  Positive for activity change. Negative for appetite change, fatigue and fever.  HENT:  Positive for ear pain. Negative for congestion, ear discharge, hearing loss, sinus pressure, sneezing and sore throat.   Respiratory:  Negative for cough.   Gastrointestinal:  Negative for diarrhea, nausea and vomiting.  Musculoskeletal:  Negative for arthralgias and myalgias.     Physical Exam Triage Vital Signs ED Triage Vitals [12/02/24 0903]  Encounter Vitals Group     BP 133/84     Girls Systolic BP Percentile      Girls Diastolic BP Percentile      Boys Systolic BP Percentile      Boys Diastolic BP Percentile      Pulse Rate 83     Resp 16     Temp 97.7 F (36.5 C)     Temp Source  Oral     SpO2 97 %     Weight      Height      Head Circumference      Peak Flow      Pain Score 9     Pain Loc      Pain Education      Exclude from Growth Chart    No data found.  Updated Vital Signs BP 133/84 (BP Location: Left Arm)   Pulse 83   Temp 97.7 F (36.5 C) (Oral)   Resp 16   LMP  (LMP Unknown)   SpO2 97%   Visual Acuity Right Eye Distance:   Left Eye Distance:   Bilateral Distance:    Right Eye Near:   Left Eye Near:    Bilateral Near:     Physical Exam Vitals reviewed.  Constitutional:      General: She is awake. She is not in acute distress.    Appearance: Normal appearance. She is well-developed. She is not ill-appearing.     Comments: Very pleasant female appears stated age in no acute distress sitting comfortably in exam room  HENT:     Head: Normocephalic and atraumatic.     Right Ear: Tympanic membrane, ear canal and external ear normal. Swelling and tenderness present. Tympanic membrane is not erythematous or bulging.     Left Ear: Tympanic membrane, ear canal and external ear normal. Tympanic membrane is not erythematous or bulging.     Ears:      Comments: Right ear: Significant and pain with palpation of tragus and with manipulation of external ear.  Erythema and edema noted of external auditory canal.  Able to visualize approximately 40% of TM that appears normal.    Mouth/Throat:     Pharynx: Uvula midline. No oropharyngeal exudate or posterior oropharyngeal erythema.  Cardiovascular:     Rate and Rhythm: Normal rate and regular rhythm.     Heart sounds: Normal heart sounds, S1 normal and S2 normal. No murmur heard. Pulmonary:     Effort: Pulmonary effort is normal.     Breath sounds: Normal breath sounds. No wheezing, rhonchi or rales.     Comments: Clear to auscultation bilaterally Psychiatric:        Behavior: Behavior is cooperative.      UC Treatments / Results  Labs (all labs ordered are listed, but only abnormal results are displayed) Labs Reviewed - No data to display  EKG   Radiology No results found.  Procedures Procedures (including critical care time)  Medications Ordered in UC Medications - No data to display  Initial Impression / Assessment and Plan / UC Course  I have reviewed the triage vital signs and the nursing notes.  Pertinent labs & imaging results that were available during my care of the patient were reviewed by me and considered in my medical decision making (see chart for details).     Patient is well-appearing, afebrile, nontoxic, nontachycardic.  Concern for otitis externa given pain with palpation of tragus and pain with manipulation of external ear with associated erythema and edema of external auditory canal.  Visualized TM does not appear infected and intact.  Will start ofloxacin twice daily for 10 days.  Discussed that she is to keep her ear facing up for a few minutes after applying the medication to allow to completely penetrate the ear canal.  She can use Tylenol  and ibuprofen  for pain relief.  We discussed that if her symptoms are not improving  within a few days of starting  the medication or if at any point worsens and she has high fever, worsening ear pain, otorrhea, hearing loss, nausea, vomiting, headache she should be seen emergently.  Return precautions given.  Excuse note provided.  Final Clinical Impressions(s) / UC Diagnoses   Final diagnoses:  Infective otitis externa of right ear  Right ear pain     Discharge Instructions      We are treating you for an ear infection of the auditory canal.  Apply ofloxacin drops twice daily for 10 days.  Keep your ear facing up for a few minutes after applying the drops to allow this to go all the way to the ear canal.  Alternate Tylenol  and ibuprofen  for pain.  If you are not feeling better within 1 to 2 days please return for reevaluation.  If anything worsens you have increasing pain, drainage from the ear, fever, difficulty hearing you should be reevaluated.     ED Prescriptions     Medication Sig Dispense Auth. Provider   ofloxacin (FLOXIN) 0.3 % OTIC solution Place 5 drops into the right ear 2 (two) times daily for 10 days. 5 mL Lasya Vetter K, PA-C      PDMP not reviewed this encounter.    [1]  Social History Tobacco Use   Smoking status: Never   Smokeless tobacco: Never  Vaping Use   Vaping status: Never Used  Substance Use Topics   Alcohol use: No   Drug use: No     Sherrell Rocky POUR, PA-C 12/02/24 0928  "

## 2024-12-05 ENCOUNTER — Ambulatory Visit (HOSPITAL_COMMUNITY)

## 2024-12-05 ENCOUNTER — Ambulatory Visit: Admitting: Family Medicine

## 2024-12-05 VITALS — BP 127/87 | HR 81 | Ht 62.0 in | Wt 242.0 lb

## 2024-12-05 DIAGNOSIS — H9201 Otalgia, right ear: Secondary | ICD-10-CM | POA: Diagnosis present

## 2024-12-05 MED ORDER — CIPROFLOXACIN-DEXAMETHASONE 0.3-0.1 % OT SUSP: 4.0000 [drp] | Freq: Two times a day (BID) | OTIC | 0 refills | Status: AC

## 2024-12-05 NOTE — Patient Instructions (Signed)
 It was great to see you! Thank you for allowing me to participate in your care!  Our plans for today:   VISIT SUMMARY: You were seen today for a right ear infection and persistent ear pain. You have been experiencing severe throbbing pain that disrupts your sleep, and previous treatments have provided minimal relief.  YOUR PLAN: RIGHT OTITIS EXTERNA: You have a persistent ear infection with severe pain despite previous treatment. There is no fever, discharge, or hearing loss. -Start using Ciprodex  ear drops as prescribed. -Take Tylenol  along with ibuprofen  and oxycodone , rotating every six hours. -Return for a follow-up visit if there is no improvement by Thursday.    Please arrive 15 minutes PRIOR to your next scheduled appointment time! If you do not, this affects OTHER patients' care.  Take care and seek immediate care sooner if you develop any concerns.   Ozell Provencal, MD, PGY-3 [facility] 11:53 AM 12/05/2024  [facility]

## 2024-12-05 NOTE — Progress Notes (Signed)
" ° ° °  SUBJECTIVE:   CHIEF COMPLAINT / HPI: ear pain  Discussed the use of AI scribe software for clinical note transcription with the patient, who gave verbal consent to proceed.  History of Present Illness Jasmin Collins is a 59 year old female who presents with a right ear infection and persistent ear pain.  Otalgia and ear infection - Persistent severe throbbing right ear pain rated 7-8/10 - Pain disrupts sleep - Recently evaluated at urgent care for right ear pain - Prescribed ear drops twice daily with minimal improvement - No ear discharge - No hearing loss  Associated symptoms - No fever - No cough - No congestion  Analgesic use and response - Uses ibuprofen  and oxycodone  which has helped some - Has not tried acetaminophen     PERTINENT  PMH / PSH: GERD, type 2 diabetes history of pyelonephritis: Ureteral stent  OBJECTIVE:   BP 127/87   Pulse 81   Ht 5' 2 (1.575 m)   Wt 242 lb (109.8 kg)   LMP  (LMP Unknown)   SpO2 97%   BMI 44.26 kg/m   Physical Exam General: NAD, well appearing Neuro: A&O Cardio: RRR, no murmurs HEENT: right external auditory canal erythematous, no discharge, visualized TM normal Respiratory: normal WOB on RA, CTAB Extremities: Moving all 4 extremities equally   ASSESSMENT/PLAN:   Assessment & Plan Right ear pain Viral etiology versus otitis externa.  Exam would be atypical for classic otitis externa.  Reasonable to complete topical antibiotic course for 10 days.  Will switch to Ciprodex  as dexamethasone  may provide more pain relief.  No red flags. - Prescribed Ciprodex  ear drops 4 drops twice daily for 7 more days. - Advised Tylenol  with ibuprofen  and oxycodone , rotating every six hours. - Instructed to return if no improvement by Thursday.   Return if symptoms worsen or fail to improve.  Ozell Provencal, MD, PGY-3 [facility] 11:55 AM 12/05/2024  [facility]   "

## 2024-12-17 ENCOUNTER — Other Ambulatory Visit (HOSPITAL_COMMUNITY): Payer: Self-pay

## 2024-12-17 MED ORDER — OXYCODONE-ACETAMINOPHEN 10-325 MG PO TABS
1.0000 | ORAL_TABLET | Freq: Four times a day (QID) | ORAL | 0 refills | Status: AC | PRN
  Filled 2024-12-19: qty 120, 30d supply, fill #0

## 2024-12-19 ENCOUNTER — Other Ambulatory Visit: Payer: Self-pay

## 2024-12-19 ENCOUNTER — Other Ambulatory Visit (HOSPITAL_COMMUNITY): Payer: Self-pay

## 2024-12-21 ENCOUNTER — Other Ambulatory Visit (HOSPITAL_COMMUNITY): Payer: Self-pay

## 2024-12-21 ENCOUNTER — Other Ambulatory Visit: Payer: Self-pay

## 2024-12-22 ENCOUNTER — Other Ambulatory Visit: Payer: Self-pay | Admitting: Family Medicine

## 2024-12-22 ENCOUNTER — Other Ambulatory Visit: Payer: Self-pay

## 2024-12-22 ENCOUNTER — Other Ambulatory Visit (HOSPITAL_COMMUNITY): Payer: Self-pay

## 2024-12-22 MED ORDER — POLYETHYLENE GLYCOL 3350 17 G PO PACK
17.0000 g | PACK | Freq: Every day | ORAL | 0 refills | Status: AC | PRN
  Filled 2024-12-22: qty 14, 14d supply, fill #0

## 2025-01-04 ENCOUNTER — Other Ambulatory Visit (HOSPITAL_COMMUNITY): Payer: Self-pay
